# Patient Record
Sex: Male | Born: 1955 | Race: White | Hispanic: No | Marital: Married | Smoking: Former smoker
Health system: Southern US, Community
[De-identification: ages and names within clinical notes are randomized; demographics above are authoritative.]

## PROBLEM LIST (undated history)

## (undated) ENCOUNTER — Emergency Department (HOSPITAL_BASED_OUTPATIENT_CLINIC_OR_DEPARTMENT_OTHER): Payer: BLUE CROSS/BLUE SHIELD | Source: Home / Self Care

## (undated) DIAGNOSIS — E785 Hyperlipidemia, unspecified: Secondary | ICD-10-CM

## (undated) DIAGNOSIS — F329 Major depressive disorder, single episode, unspecified: Secondary | ICD-10-CM

## (undated) DIAGNOSIS — M5136 Other intervertebral disc degeneration, lumbar region: Secondary | ICD-10-CM

## (undated) DIAGNOSIS — M542 Cervicalgia: Secondary | ICD-10-CM

## (undated) DIAGNOSIS — G8929 Other chronic pain: Secondary | ICD-10-CM

## (undated) DIAGNOSIS — G709 Myoneural disorder, unspecified: Secondary | ICD-10-CM

## (undated) DIAGNOSIS — G4700 Insomnia, unspecified: Secondary | ICD-10-CM

## (undated) DIAGNOSIS — M51369 Other intervertebral disc degeneration, lumbar region without mention of lumbar back pain or lower extremity pain: Secondary | ICD-10-CM

## (undated) DIAGNOSIS — Z8489 Family history of other specified conditions: Secondary | ICD-10-CM

## (undated) DIAGNOSIS — R7303 Prediabetes: Secondary | ICD-10-CM

## (undated) DIAGNOSIS — N503 Cyst of epididymis: Secondary | ICD-10-CM

## (undated) DIAGNOSIS — I10 Essential (primary) hypertension: Secondary | ICD-10-CM

## (undated) DIAGNOSIS — N529 Male erectile dysfunction, unspecified: Secondary | ICD-10-CM

## (undated) DIAGNOSIS — M1990 Unspecified osteoarthritis, unspecified site: Secondary | ICD-10-CM

## (undated) DIAGNOSIS — Z8249 Family history of ischemic heart disease and other diseases of the circulatory system: Secondary | ICD-10-CM

## (undated) DIAGNOSIS — G43909 Migraine, unspecified, not intractable, without status migrainosus: Secondary | ICD-10-CM

## (undated) DIAGNOSIS — F909 Attention-deficit hyperactivity disorder, unspecified type: Secondary | ICD-10-CM

## (undated) DIAGNOSIS — F32A Depression, unspecified: Secondary | ICD-10-CM

## (undated) HISTORY — DX: Essential (primary) hypertension: I10

## (undated) HISTORY — DX: Other chronic pain: G89.29

## (undated) HISTORY — DX: Family history of ischemic heart disease and other diseases of the circulatory system: Z82.49

## (undated) HISTORY — DX: Major depressive disorder, single episode, unspecified: F32.9

## (undated) HISTORY — DX: Migraine, unspecified, not intractable, without status migrainosus: G43.909

## (undated) HISTORY — PX: VASECTOMY: SHX75

## (undated) HISTORY — DX: Hyperlipidemia, unspecified: E78.5

## (undated) HISTORY — PX: NECK SURGERY: SHX720

## (undated) HISTORY — DX: Cyst of epididymis: N50.3

## (undated) HISTORY — DX: Insomnia, unspecified: G47.00

## (undated) HISTORY — DX: Male erectile dysfunction, unspecified: N52.9

## (undated) HISTORY — DX: Attention-deficit hyperactivity disorder, unspecified type: F90.9

## (undated) HISTORY — DX: Unspecified osteoarthritis, unspecified site: M19.90

## (undated) HISTORY — DX: Other intervertebral disc degeneration, lumbar region without mention of lumbar back pain or lower extremity pain: M51.369

## (undated) HISTORY — DX: Cervicalgia: M54.2

## (undated) HISTORY — DX: Depression, unspecified: F32.A

## (undated) HISTORY — DX: Other intervertebral disc degeneration, lumbar region: M51.36

---

## 2001-09-15 ENCOUNTER — Encounter: Admission: RE | Admit: 2001-09-15 | Discharge: 2001-09-15 | Payer: Self-pay | Admitting: *Deleted

## 2001-10-09 ENCOUNTER — Encounter: Admission: RE | Admit: 2001-10-09 | Discharge: 2001-10-09 | Payer: Self-pay | Admitting: Internal Medicine

## 2003-06-21 ENCOUNTER — Encounter: Admission: RE | Admit: 2003-06-21 | Discharge: 2003-06-21 | Payer: Self-pay | Admitting: Family Medicine

## 2007-01-09 ENCOUNTER — Ambulatory Visit (HOSPITAL_BASED_OUTPATIENT_CLINIC_OR_DEPARTMENT_OTHER): Admission: RE | Admit: 2007-01-09 | Discharge: 2007-01-09 | Payer: Self-pay | Admitting: Internal Medicine

## 2007-01-11 ENCOUNTER — Ambulatory Visit: Payer: Self-pay | Admitting: Internal Medicine

## 2007-01-15 ENCOUNTER — Encounter: Admission: RE | Admit: 2007-01-15 | Discharge: 2007-01-15 | Payer: Self-pay | Admitting: Neurosurgery

## 2007-04-15 ENCOUNTER — Ambulatory Visit (HOSPITAL_COMMUNITY): Admission: RE | Admit: 2007-04-15 | Discharge: 2007-04-16 | Payer: Self-pay | Admitting: Neurosurgery

## 2008-05-10 ENCOUNTER — Ambulatory Visit: Payer: Self-pay | Admitting: Family Medicine

## 2008-05-10 DIAGNOSIS — E785 Hyperlipidemia, unspecified: Secondary | ICD-10-CM | POA: Insufficient documentation

## 2008-05-10 DIAGNOSIS — I10 Essential (primary) hypertension: Secondary | ICD-10-CM | POA: Insufficient documentation

## 2008-05-10 DIAGNOSIS — F329 Major depressive disorder, single episode, unspecified: Secondary | ICD-10-CM | POA: Insufficient documentation

## 2008-05-10 LAB — CONVERTED CEMR LAB
ALT: 51 units/L (ref 0–53)
AST: 31 units/L (ref 0–37)
Albumin: 4.4 g/dL (ref 3.5–5.2)
BUN: 18 mg/dL (ref 6–23)
Basophils Absolute: 0 10*3/uL (ref 0.0–0.1)
Basophils Relative: 0.3 % (ref 0.0–3.0)
Calcium: 9.8 mg/dL (ref 8.4–10.5)
Creatinine, Ser: 0.8 mg/dL (ref 0.4–1.5)
Eosinophils Absolute: 0.1 10*3/uL (ref 0.0–0.7)
Eosinophils Relative: 1.7 % (ref 0.0–5.0)
GFR calc Af Amer: 131 mL/min
GFR calc non Af Amer: 108 mL/min
MCV: 91 fL (ref 78.0–100.0)
Neutro Abs: 2.9 10*3/uL (ref 1.4–7.7)
Platelets: 188 10*3/uL (ref 150–400)
Potassium: 4.5 meq/L (ref 3.5–5.1)
Sodium: 139 meq/L (ref 135–145)
TSH: 0.28 microintl units/mL — ABNORMAL LOW (ref 0.35–5.50)
Total Protein: 7.2 g/dL (ref 6.0–8.3)

## 2008-05-11 ENCOUNTER — Telehealth: Payer: Self-pay | Admitting: Family Medicine

## 2008-05-17 ENCOUNTER — Telehealth: Payer: Self-pay | Admitting: *Deleted

## 2008-05-24 ENCOUNTER — Telehealth: Payer: Self-pay | Admitting: *Deleted

## 2008-06-01 ENCOUNTER — Ambulatory Visit: Payer: Self-pay | Admitting: Family Medicine

## 2008-06-01 DIAGNOSIS — J45909 Unspecified asthma, uncomplicated: Secondary | ICD-10-CM | POA: Insufficient documentation

## 2008-06-01 DIAGNOSIS — M1990 Unspecified osteoarthritis, unspecified site: Secondary | ICD-10-CM | POA: Insufficient documentation

## 2008-07-20 ENCOUNTER — Telehealth: Payer: Self-pay | Admitting: Family Medicine

## 2008-07-20 ENCOUNTER — Ambulatory Visit: Payer: Self-pay | Admitting: Family Medicine

## 2008-07-21 ENCOUNTER — Ambulatory Visit: Payer: Self-pay | Admitting: Family Medicine

## 2008-07-21 LAB — CONVERTED CEMR LAB: Testosterone: 393.57 ng/dL (ref 350.00–890.00)

## 2008-09-15 ENCOUNTER — Ambulatory Visit: Payer: Self-pay | Admitting: Family Medicine

## 2008-09-20 LAB — CONVERTED CEMR LAB
ALT: 48 units/L (ref 0–53)
AST: 34 units/L (ref 0–37)
Albumin: 3.9 g/dL (ref 3.5–5.2)
Alkaline Phosphatase: 69 units/L (ref 39–117)
Bilirubin, Direct: 0 mg/dL (ref 0.0–0.3)
Cholesterol: 179 mg/dL (ref 0–200)
HDL: 37.6 mg/dL — ABNORMAL LOW (ref >39.00)
LDL Cholesterol: 110 mg/dL — ABNORMAL HIGH (ref 0–99)
Testosterone: 254 ng/dL — ABNORMAL LOW (ref 350.00–890.00)
Total Bilirubin: 0.7 mg/dL (ref 0.3–1.2)
Total CHOL/HDL Ratio: 5
Total Protein: 7.1 g/dL (ref 6.0–8.3)
Triglycerides: 157 mg/dL — ABNORMAL HIGH (ref 0.0–149.0)
VLDL: 31.4 mg/dL (ref 0.0–40.0)

## 2008-10-14 ENCOUNTER — Ambulatory Visit: Payer: Self-pay | Admitting: Family Medicine

## 2008-10-18 ENCOUNTER — Telehealth: Payer: Self-pay | Admitting: Family Medicine

## 2009-03-03 ENCOUNTER — Telehealth: Payer: Self-pay | Admitting: Family Medicine

## 2009-05-13 ENCOUNTER — Encounter: Payer: Self-pay | Admitting: Family Medicine

## 2009-05-18 ENCOUNTER — Telehealth: Payer: Self-pay | Admitting: Family Medicine

## 2009-05-24 ENCOUNTER — Ambulatory Visit: Payer: Self-pay | Admitting: Family Medicine

## 2009-05-26 LAB — CONVERTED CEMR LAB
AST: 34 units/L (ref 0–37)
Alkaline Phosphatase: 63 units/L (ref 39–117)
BUN: 18 mg/dL (ref 6–23)
Bilirubin, Direct: 0.1 mg/dL (ref 0.0–0.3)
CO2: 29 meq/L (ref 19–32)
Calcium: 9.7 mg/dL (ref 8.4–10.5)
Creatinine, Ser: 0.9 mg/dL (ref 0.4–1.5)
GFR calc non Af Amer: 94.1 mL/min (ref >60)
Glucose, Bld: 84 mg/dL (ref 70–99)
Sodium: 140 meq/L (ref 135–145)
Total Bilirubin: 0.7 mg/dL (ref 0.3–1.2)

## 2009-09-09 ENCOUNTER — Telehealth: Payer: Self-pay | Admitting: Family Medicine

## 2009-11-10 ENCOUNTER — Telehealth: Payer: Self-pay | Admitting: Family Medicine

## 2010-06-06 NOTE — Progress Notes (Signed)
Summary: requesting labs  Phone Note Call from Patient Call back at [phone]   Caller: Patient-live call Summary of Call: on chloresterol meds. pt is requesting to have liver labs done. ok to order? Initial call taken by: Warnell Forester,  May 18, 2009 4:36 PM  Follow-up for Phone Call        cpx 5/11.............Marland Kitchenok to set up cpx 5/11........labs ok untill then Follow-up by: Roderick Pee MD,  May 18, 2009 5:14 PM  Additional Follow-up for Phone Call Additional follow up Details #1::        Had labs done at [facility] and his enzyme count was 94. He does not trust the [place]. that is why he is requesting the labs earlier. He did take some tylenol prior to having the labs done. Additional Follow-up by: Warnell Forester,  May 19, 2009 2:23 PM    Additional Follow-up for Phone Call Additional follow up Details #2::    per Dr. Tawanna Cooler ok for labs to be done.........Marland KitchenDoristine Devoid  May 19, 2009 5:09 PM   Additional Follow-up for Phone Call Additional follow up Details #3:: Details for Additional Follow-up Action Taken: will come in on next Tuesday to have ALT labs per dr Tawanna Cooler. Additional Follow-up by: Warnell Forester,  May 20, 2009 9:35 AM

## 2010-06-06 NOTE — Assessment & Plan Note (Signed)
Summary: ALT (SGPT)-OK PER DR Bradrick Kamau//CCM   Vital Signs:  Patient profile:   55 year old male Weight:      209 pounds Temp:     98.1 degrees F Pulse rate:   60 / minute Resp:     12 per minute BP sitting:   122 / 98  (left arm)  Vitals Entered By: Kern Reap CMA Duncan Dull) (May 24, 2009 11:25 AM)   History of Present Illness: Aaron Adams is a 55 year old male comes in today for evaluation of two problems.  He went to the [place] for physical and was told his liver function study was elevated.  His ALT was 94 are normal.  63.  At that time.  He was drinking one to two beers for his evening meal daily.  He stopped the alcohol and is here for follow-up.  Liver functions.  He also takes simvastatin 20 mg nightly, which may also cause abnormalities, and LFTs.  GI review of systems negative.  Yesterday he awoke with severe right and left lower lumbar back pain.  He describes the pain as constant, sharp, a 9 on a scale of one to 10 without radiation.  Neurologic review of systems negative.  This has been a recurrent problem.  Dr. Venetia Maxon is his neurosurgeon is operate on his neck.  Is also seen in for low back pain.  He still only has degenerative disk disease.  Preventive Screening-Counseling & Management  Alcohol-Tobacco     Smoking Status: quit  Comments: quit age 7  Allergies (verified): No Known Drug Allergies  Comments:  Nurse/Medical Assistant: c/o muscle spasm lower back since yesterday, difficulty straightening when bends over--tried motrin 800mg  yesterday and venlafaxin cream without relief  The patient's medications and allergies were reviewed with the patient and were updated in the Medication and Allergy Lists. Kern Reap CMA Duncan Dull) (May 24, 2009 11:29 AM)  Past History:  Past medical, surgical, family and social histories (including risk factors) reviewed for relevance to current acute and chronic problems.  Past Medical History: Reviewed history from 06/01/2008  and no changes required. Depression Hyperlipidemia Hypertension Allergic rhinitis Asthma erectile dysfunction low testosterone Osteoarthritis  Past Surgical History: Reviewed history from 05/10/2008 and no changes required. neck surgery x 2  Family History: Reviewed history from 05/10/2008 and no changes required. Father: hx of back surgery Mother: fibermyalgia , deg. disk disease Siblings: 2 sisters healty                1 sister overdose  Social History: Reviewed history from 05/10/2008 and no changes required. Occupation: self IT sales professional parts Married remarried  air [employer] x 4 yr. Former Smoker Alcohol use-yes Drug use-no Regular exercise-no  Review of Systems      See HPI  Physical Exam  General:  Well-developed,well-nourished,in no acute distress; alert,appropriate and cooperative throughout examination Abdomen:  Bowel sounds positive,abdomen soft and non-tender without masses, organomegaly or hernias noted. Msk:  No deformity or scoliosis noted of thoracic or lumbar spine.   Pulses:  R and L carotid,radial,femoral,dorsalis pedis and posterior tibial pulses are full and equal bilaterally Extremities:  No clubbing, cyanosis, edema, or deformity noted with normal full range of motion of all joints.   Neurologic:  No cranial nerve deficits noted. Station and gait are normal. Plantar reflexes are down-going bilaterally. DTRs are symmetrical throughout. Sensory, motor and coordinative functions appear intact.   Problems:  Medical Problems Added: 1)  Dx of Abnormal Blood Chemistry , Other  (ICD-790.6) 2)  Dx  of Back Pain  (ICD-724.5)  Impression & Recommendations:  Problem # 1:  ABNORMAL BLOOD CHEMISTRY , OTHER (ICD-790.6) Assessment New  Orders: Venipuncture (29562) TLB-BMP (Basic Metabolic Panel-BMET) (80048-METABOL) TLB-Hepatic/Liver Function Pnl (80076-HEPATIC) Prescription Created Electronically [id number])  Problem # 2:  BACK PAIN  (ICD-724.5) Assessment: New  His updated medication list for this problem includes:    Vicodin Es 7.5-750 Mg Tabs (Hydrocodone-acetaminophen) .Marland Kitchen... Take 1 tablet by mouth four times a day  Orders: Venipuncture (57846) TLB-BMP (Basic Metabolic Panel-BMET) (80048-METABOL) TLB-Hepatic/Liver Function Pnl (80076-HEPATIC) Prescription Created Electronically [id number])  Complete Medication List: 1)  Simvastatin 20 Mg Tabs (Simvastatin) .... Take one tab once daily 2)  Cymbalta 60 Mg Cpep (Duloxetine hcl) .... Take 1 capsule by mouth once a day 3)  Viagra 50 Mg Tabs (Sildenafil citrate) .... Uad 4)  Androgel Pump 1 % Gel (Testosterone) .... 2 pumps daily 5)  Lisinopril 40 Mg Tabs (Lisinopril) .... Take 1 tablet by mouth every morning 6)  Vicodin Es 7.5-750 Mg Tabs (Hydrocodone-acetaminophen) .... Take 1 tablet by mouth four times a day  Patient Instructions: 1)  stay at complete bed rest for 48 hours, then begin ambulation walking and lying down and avoid sitting.  Take prednisone two tabs daily for 3 days, one for 3 days, a half for 3 days, then stop. 2)  He may also take Vicodin, one tablet 4 times a day as needed for severe pain. 3)  I will call you w y  Lab work.   Prescriptions: VICODIN ES 7.5-750 MG TABS (HYDROCODONE-ACETAMINOPHEN) Take 1 tablet by mouth four times a day  #50 x 1   Entered and Authorized by:   Roderick Pee MD   Signed by:   Roderick Pee MD on 05/24/2009   Method used:   Print then Give to Patient   RxID:   [id number]

## 2010-06-06 NOTE — Progress Notes (Signed)
Summary: rx  Phone Note Call from Patient   Summary of Call: patient is calling because he is in [place] and forgot to take his medication with him.   he would like a 5 day supply.  is this okay to fill? Initial call taken by: Kern Reap CMA Duncan Dull),  Sep 09, 2009 2:01 PM  Follow-up for Phone Call        ok Follow-up by: Roderick Pee MD,  Sep 09, 2009 2:05 PM  Additional Follow-up for Phone Call Additional follow up Details #1::        Phone Call Completed  week supply called into [phone] Additional Follow-up by: Kern Reap CMA Duncan Dull),  Sep 09, 2009 3:16 PM

## 2010-06-06 NOTE — Progress Notes (Signed)
Summary: cymbalta  Phone Note Call from Patient   Summary of Call: would like a refill of cymbalta at a lower dose Initial call taken by: Kern Reap CMA Duncan Dull),  November 10, 2009 9:57 AM    New/Updated Medications: CYMBALTA 30 MG CPEP (DULOXETINE HCL) take one tab by mouth once daily Prescriptions: CYMBALTA 30 MG CPEP (DULOXETINE HCL) take one tab by mouth once daily  #90 x 3   Entered by:   Kern Reap CMA (AAMA)   Authorized by:   Roderick Pee MD   Signed by:   Kern Reap CMA (AAMA) on 11/10/2009   Method used:   [facility] to        [facility]  [address] [phone]* (retail)       [address] [place], [place]  [address]       Ph: [phone] or [phone]       Fax: [phone]   RxID:   [id number]

## 2010-06-19 ENCOUNTER — Other Ambulatory Visit: Payer: Self-pay | Admitting: Family Medicine

## 2010-07-06 HISTORY — PX: OTHER SURGICAL HISTORY: SHX169

## 2010-07-16 ENCOUNTER — Other Ambulatory Visit: Payer: Self-pay | Admitting: Family Medicine

## 2010-09-19 NOTE — Procedures (Signed)
Aaron Adams, Aaron Adams              ACCOUNT NO.:  [id number]   MEDICAL RECORD NO.:  [id number]          PATIENT TYPE:  OUT   LOCATION:  SLEEP CENTER                 FACILITY:  [facility]   PHYSICIAN:  Clinton D. Maple Hudson, MD, FCCP, FACPDATE OF BIRTH:  06-02-56   DATE OF STUDY:  01/09/2007                            NOCTURNAL POLYSOMNOGRAM   REFERRING PHYSICIAN:  Marland Kitchen, M.D.   INDICATION FOR STUDY:   EPWORTH SLEEPINESS SCORE:   MEDICATIONS:  Home medications listed:  Ambien 10 mg, simvastatin,  lisinopril, Effexor.   A diagnostic NPSG protocol was requested.   SLEEP ARCHITECTURE:  Total sleep time 323 minutes with sleep efficiency  71%.  Stage I is 5%, stage II 87%, stage III absent, REM 8% of total  sleep time.  Sleep latency 86 minutes, REM latency 325 minutes, awake  after sleep onset 51 minutes, arousal index 7.2.  Ambien 10 mg was taken  at 10:35 p.m. and a second dose of 1/2 Ambien tablet was taken at 11:55  p.m. for difficulty initiating sleep.   RESPIRATORY DATA:  Diagnostic NPSG protocol requested.  Apnea-hypopnea  index (AHI, RDI) 25.8 obstructive events per hour, indicating moderate  obstructive sleep apnea/hypopnea syndrome.  This reflected one  obstructive apnea and 138 hypopneas.  Events were not positional.  REM  AHI 0 per hour.   OXYGEN DATA:  Loud snoring with oxygen desaturation nadir 85%.  Mean  oxygen saturation through the study was 97% on room air.   CARDIAC DATA:  Normal sinus rhythm.   MOVEMENT-PARASOMNIA:  Occasional limb jerk with insignificant effect on  sleep.  Bathroom x1.   IMPRESSIONS-RECOMMENDATIONS:  1. Sleep architecture remarkable for sustained sleep only after      midnight, having taken 15 mg of Ambien.  Reduced percentage rapid      eye movement is nonspecific and may reflect unfamiliar environment      but may also reflect use of antidepressant medication.  2. Moderate obstructive sleep apnea/hypopnea syndrome, apnea-hypopnea  index 25.8 per hour.  Events were mostly hypopneas and      nonpositional  Loud snoring was associated with oxygen desaturation      nadir of 85% and mean oxygen saturation through the study is 97% on      room air.  3. Continuous positive airway pressure therapy was not initiated      because of the order protocol requested.      Consider return for continuous positive airway pressure titration      or evaluate for alternative therapies as appropriate.      Clinton D. Maple Hudson, MD, [facility], FACP  Diplomate, Biomedical engineer of Sleep Medicine  Electronically Signed     CDY/MEDQ  D:  01/12/2007 09:44:30  T:  01/12/2007 17:28:26  Job:  [id number]   cc:   Marland Kitchen, M.D.  [facility]  [address]  [place], [place] [address]

## 2010-09-19 NOTE — Op Note (Signed)
[name], [name]              ACCOUNT NO.:  [id number]   MEDICAL RECORD NO.:  [id number]          PATIENT TYPE:  OIB   LOCATION:  3172                         FACILITY:  [facility]   PHYSICIAN:  Danae Orleans. Venetia Maxon, M.D.  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   OPERATIONS:  1. Exploration and fusion C5 through C7 levels.  2. Removal of anterior cervical plate C5 through C7.  3. Anterior cervical decompression and fusion C4 through C5 with      allograft bone graft, autograft and anterior cervical plate.   SURGEON:  Danae Orleans. Venetia Maxon, M.D.   Threasa HeadsMarisa Hua.   ANESTHESIA:  General endotracheal anesthesia.   ESTIMATED BLOOD LOSS:  Minimal.   COMPLICATIONS:  None.   DISPOSITION:  Recovery.   INDICATIONS FOR PROCEDURE:  Aaron Adams is a 55 year old man with  neck pain and a disk herniation at C4-5.  He has left C4 radiculopathy  and myelopathy.  He has had prior surgery at C5 through C7 levels.  He  also has neck pain.  It was elected to take him to surgery to confirm  his previous fusion is solid and also to remove the anterior cervical  plate and then to perform anterior cervical decompression and fusion at  C4 through C5 levels.   DESCRIPTION OF PROCEDURE:  Aaron Adams was brought to the operating  room.  Following the satisfactory and uncomplicated induction of general  endotracheal anesthesia and placement of intravenous lines, the patient  was placed in the supine position on the operating table.  His neck was  maintained in neutral alignment.  An incision was made on the  right side  of midline which is the site of his prior surgery slightly above his  previous incision, carried through the platysma layer and then  subplatysmal dissection was performed exposing the anterior border of  the sternocleidomastoid muscle using blunt dissection.  Operating  through previous scar tissue, the anterior cervical spine was  identified.  Using a variety of Kittner pushes, the investing soft  tissue was removed overlying the previously placed Orion plate.  The  previously placed screws were then removed and the plate was removed.  This required a considerable amount of effort because there appeared to  be bone overgrowth overlying the plate.  This was chiseled out with  resultant loosening of the plate so that it could be removed.  Subsequently using loupe magnification, the sites of previous bone  grafting were very carefully inspected and there did not appear to be  any lucency or evidence of any pseudoarthrosis.  There did not appear to  be motion at the previously operated level.  Subsequently bent spinal  needle was placed over the C4-5 level and this was confirmed  on  intraoperative x-ray.  The longus colli muscles were taken down from the  anterior cervical spine.  Self-retaining shadow line retractor along  with up and down retraction was placed to facilitate exposure.  Large  ventral osteophyte was removed with Leksell rongeur and saved for later  use in bone grafting.  Distraction pins were placed at C4 and C5.  Using  distractor, the interspace was opened.  The endplates were stripped of  residual disk material and removed with variety of pituitary rongeurs.  Subsequently the endplates were decorticated using high speed drill  along with uncinate spurs which were removed.  These bone drilled was  then saved for later use in bone grafting.  Subsequently the microscope  was brought into the field.  Using microdissection technique, the  herniated disk was  removed.  The spinal cord dura was decompressed.  Particularly on the left the C5 nerve root was extensively decompressed  so that it extended out the neural foramen and also the right C4-5  foramen was decompressed.  Hemostasis was assured.  After trial sizing,  a 6 mm bone allograft wedge was selected, fashioned with high speed  drill, packed with morcellized bone autograft, inserted into the  interspace and counter sunk appropriately.  Retraction was removed.  The  14 mm trestle anterior cervical plate was then placed with two 14 mm  screws at C4 and two 14 mm screws at C5.  All screws had excellent  purchase.  Locking mechanisms were engaged.  Final x-ray demonstrated  well positioned interbody graft in the anterior cervical plate.  There  appeared to be some oozing from soft tissues.  The screw holes were  packed with Gelfoam.  Hemostasis was assured.  The wound was irrigated.  Soft tissues were inspected and found to be in good repair.  Because of  the oozing from the previously operated site, it was elected to place a  #7 JP drain which was placed through a separate stab incision.  The  platysma layer was then closed with 3-0 Vicryl sutures.  The skin edges  were approximated with 3-0 Vicryl interrupted inverted sutures.  The  wound was dressed with Benzoin, Steri-Strips, Telfa gauze and tape.  The  patient was extubated in the operating room and taken to the recovery  room in satisfactory condition having tolerated the operation well.  Counts were correct at the end of the case.      Danae Orleans. Venetia Maxon, M.D.  Electronically Signed     JDS/MEDQ  D:  04/15/2007  T:  04/15/2007  Job:  [id number]

## 2010-10-04 ENCOUNTER — Other Ambulatory Visit: Payer: Self-pay | Admitting: *Deleted

## 2010-10-04 MED ORDER — LISINOPRIL 40 MG PO TABS: 40.0000 mg | ORAL_TABLET | Freq: Every day | ORAL | Status: DC

## 2011-02-07 ENCOUNTER — Telehealth: Payer: Self-pay | Admitting: Family Medicine

## 2011-02-07 NOTE — Telephone Encounter (Signed)
patient  Would like to know if he could go to a cardiologist Dr Imogene Burn?  And also he would like a refill of Androgel?

## 2011-02-07 NOTE — Telephone Encounter (Signed)
His father had scans done on his regarding his internal arteries, that was done @ [facility]. They told him there that it may be hereditary and would like to speak with Fleet Contras about this and his meds.

## 2011-02-08 MED ORDER — TESTOSTERONE 20.25 MG/ACT (1.62%) TD GEL: 1.0000 | Freq: Every day | TRANSDERMAL | Status: DC

## 2011-02-08 NOTE — Telephone Encounter (Signed)
Spoke with patient and referral sent and rx ready for pick up

## 2011-02-08 NOTE — Telephone Encounter (Signed)
Please refer him to Dr. Parke Simmers, one of our cardiologists for evaluation.  Okay to refill AndroGel until  Next physical

## 2011-02-12 ENCOUNTER — Telehealth: Payer: Self-pay

## 2011-02-12 LAB — BASIC METABOLIC PANEL
BUN: 12
Calcium: 9.6
Creatinine, Ser: 0.88

## 2011-02-12 LAB — CBC
Hemoglobin: 14
MCHC: 34.2
Platelets: 221
RBC: 4.5
RDW: 13.4

## 2011-02-12 NOTE — Telephone Encounter (Signed)
Pt called in and stated that he had picked up an rx for Androgel on Friday and [facility] he was told a PA needed to be done before he could get his medication.

## 2011-02-13 NOTE — Telephone Encounter (Signed)
Per [place] she has received PA

## 2011-02-19 ENCOUNTER — Encounter: Payer: Self-pay | Admitting: Cardiology

## 2011-02-21 ENCOUNTER — Encounter: Payer: Self-pay | Admitting: Cardiology

## 2011-02-21 ENCOUNTER — Ambulatory Visit (INDEPENDENT_AMBULATORY_CARE_PROVIDER_SITE_OTHER): Payer: BC Managed Care – PPO | Admitting: Cardiology

## 2011-02-21 DIAGNOSIS — I10 Essential (primary) hypertension: Secondary | ICD-10-CM

## 2011-02-21 DIAGNOSIS — Z8249 Family history of ischemic heart disease and other diseases of the circulatory system: Secondary | ICD-10-CM

## 2011-02-21 NOTE — Assessment & Plan Note (Signed)
Blood pressure controlled. Continue present medications.potassium and renal function monitored by primary care. 

## 2011-02-21 NOTE — Assessment & Plan Note (Signed)
Continue statin.managed by primary care. 

## 2011-02-21 NOTE — Assessment & Plan Note (Signed)
Patient with a family history of aneurysm. His father has a thoracic aneurysm recently diagnosed in his grandfather died in his early 50s of an abdominal aortic aneurysm. Also with risk factors of hypertension and remote tobacco use. Plan CTA to exclude thoracic and abdominal aortic aneurysm.

## 2011-02-21 NOTE — Patient Instructions (Signed)
CTA OF CHEST AND ABDOMEN AT [facility] Friday 02-23-11 @ 9AM

## 2011-02-21 NOTE — Progress Notes (Signed)
HPI: 55 year old male with no prior cardiac history or evaluation of possible aneurysm. Patient's father recently diagnosed with thoracic aneurysm in [place]. Patient was told that he also needed screening. It should be noted the patient's grandfather died of an abdominal aortic aneurysm in his early 55s. He has mild dyspnea at times but no orthopnea, PND, pedal edema, palpitations, syncope or chest pain.  Current Outpatient Prescriptions  Medication Sig Dispense Refill  . DULoxetine (CYMBALTA) 60 MG capsule Take 60 mg by mouth daily.        Marland Kitchen HYDROcodone-acetaminophen (VICODIN ES) 7.5-750 MG per tablet Take 1 tablet by mouth 4 (four) times daily.        Marland Kitchen lisinopril (PRINIVIL,ZESTRIL) 40 MG tablet Take 1 tablet (40 mg total) by mouth daily.  90 tablet  1  . sildenafil (VIAGRA) 50 MG tablet Take 50 mg by mouth as directed.        . simvastatin (ZOCOR) 20 MG tablet Take 20 mg by mouth at bedtime.        . Testosterone (ANDROGEL PUMP) 20.25 MG/ACT (1.62%) GEL Place 1 Act onto the skin daily.  75 g  5    No Known Allergies  Past Medical History  Diagnosis Date  . Depression   . Hyperlipidemia   . Hypertension   . Allergic rhinitis   . Erectile dysfunction   . Low testosterone   . Osteoarthritis     Past Surgical History  Procedure Date  . Neck surgery     Cervical fusion twice  . Rotator cuff surgery     History   Social History  . Marital Status: Married    Spouse Name: N/A    Number of Children: 3  . Years of Education: N/A   Occupational History  . Self employed - Sports administrator    Social History Main Topics  . Smoking status: Former Games developer  . Smokeless tobacco: Not on file  . Alcohol Use: Yes     Occasional  . Drug Use: No  . Sexually Active: Not on file   Other Topics Concern  . Not on file   Social History Narrative   Married, Oceanographer force x4 yearsNo regular exercise    Family History  Problem Relation Age of Onset  . Fibromyalgia Mother   .  Heart disease Father     thoracic anneurysm and valve replacement  . Aneurysm Paternal Grandfather     Abdominal    ROS: no fevers or chills, productive cough, hemoptysis, dysphasia, odynophagia, melena, hematochezia, dysuria, hematuria, rash, seizure activity, orthopnea, PND, pedal edema, claudication. Remaining systems are negative.  Physical Exam: General:  Well developed/well nourished in NAD Skin warm/dry Patient not depressed No peripheral clubbing Back-normal HEENT-normal/normal eyelids Neck supple/normal carotid upstroke bilaterally; no bruits; no JVD; no thyromegaly chest - CTA/ normal expansion CV - RRR/normal S1 and S2; no murmurs, rubs or gallops;  PMI nondisplaced Abdomen -NT/ND, no HSM, no mass, + bowel sounds, no bruit 2+ femoral pulses, no bruits Ext-no edema, chords, 2+ DP Neuro-grossly nonfocal  ECG normal sinus rhythm with no ST changes.

## 2011-02-22 ENCOUNTER — Other Ambulatory Visit: Payer: Self-pay | Admitting: Cardiology

## 2011-02-22 ENCOUNTER — Other Ambulatory Visit: Payer: Self-pay | Admitting: *Deleted

## 2011-02-22 ENCOUNTER — Telehealth: Payer: Self-pay | Admitting: Cardiology

## 2011-02-22 DIAGNOSIS — Z8249 Family history of ischemic heart disease and other diseases of the circulatory system: Secondary | ICD-10-CM

## 2011-02-22 DIAGNOSIS — IMO0001 Reserved for inherently not codable concepts without codable children: Secondary | ICD-10-CM

## 2011-02-22 DIAGNOSIS — I10 Essential (primary) hypertension: Secondary | ICD-10-CM

## 2011-02-22 NOTE — Telephone Encounter (Signed)
Pt was scheduled for CTA Chest and Abdomen on 02-23-11@[facility]. Pt's insurance [employer] would only approve CTA Chest.  Per Dr. Jens Som, CTA Abdomen was cancelled and Abdomen U/S was scheduled.  CTA Chest@9  a.m. & Abd U/S@10  a.m.  Pt agrees and understands to be NPO after midnight and will arrive@8 :45am for testing.

## 2011-02-23 ENCOUNTER — Other Ambulatory Visit: Payer: Self-pay | Admitting: Cardiology

## 2011-02-23 ENCOUNTER — Ambulatory Visit (INDEPENDENT_AMBULATORY_CARE_PROVIDER_SITE_OTHER)
Admission: RE | Admit: 2011-02-23 | Discharge: 2011-02-23 | Disposition: A | Discharge status: Home / Self Care | Payer: BC Managed Care – PPO | Source: Ambulatory Visit | Attending: Cardiology | Admitting: Cardiology

## 2011-02-23 ENCOUNTER — Other Ambulatory Visit (HOSPITAL_BASED_OUTPATIENT_CLINIC_OR_DEPARTMENT_OTHER): Payer: BC Managed Care – PPO

## 2011-02-23 ENCOUNTER — Ambulatory Visit (HOSPITAL_BASED_OUTPATIENT_CLINIC_OR_DEPARTMENT_OTHER)
Admission: RE | Admit: 2011-02-23 | Discharge: 2011-02-23 | Disposition: A | Discharge status: Home / Self Care | Payer: BC Managed Care – PPO | Source: Ambulatory Visit | Attending: Cardiology | Admitting: Cardiology

## 2011-02-23 DIAGNOSIS — IMO0001 Reserved for inherently not codable concepts without codable children: Secondary | ICD-10-CM

## 2011-02-23 DIAGNOSIS — I10 Essential (primary) hypertension: Secondary | ICD-10-CM | POA: Insufficient documentation

## 2011-02-23 DIAGNOSIS — I708 Atherosclerosis of other arteries: Secondary | ICD-10-CM | POA: Insufficient documentation

## 2011-02-23 DIAGNOSIS — Z8249 Family history of ischemic heart disease and other diseases of the circulatory system: Secondary | ICD-10-CM | POA: Insufficient documentation

## 2011-02-23 DIAGNOSIS — Z0389 Encounter for observation for other suspected diseases and conditions ruled out: Secondary | ICD-10-CM

## 2011-02-23 DIAGNOSIS — I2510 Atherosclerotic heart disease of native coronary artery without angina pectoris: Secondary | ICD-10-CM | POA: Insufficient documentation

## 2011-02-23 MED ORDER — IOHEXOL 350 MG/ML SOLN
80.0000 mL | Freq: Once | INTRAVENOUS | Status: AC | PRN
  Administered 2011-02-23: 80 mL via INTRAVENOUS

## 2011-03-01 ENCOUNTER — Encounter: Payer: Self-pay | Admitting: *Deleted

## 2011-04-02 ENCOUNTER — Encounter: Payer: Self-pay | Admitting: Family Medicine

## 2011-04-02 ENCOUNTER — Ambulatory Visit (INDEPENDENT_AMBULATORY_CARE_PROVIDER_SITE_OTHER): Payer: BC Managed Care – PPO | Admitting: Family Medicine

## 2011-04-02 VITALS — BP 125/84 | HR 83 | Temp 98.1°F | Ht 70.0 in | Wt 203.0 lb

## 2011-04-02 DIAGNOSIS — Z23 Encounter for immunization: Secondary | ICD-10-CM

## 2011-04-02 DIAGNOSIS — M545 Low back pain, unspecified: Secondary | ICD-10-CM

## 2011-04-02 DIAGNOSIS — L0292 Furuncle, unspecified: Secondary | ICD-10-CM

## 2011-04-02 DIAGNOSIS — L0293 Carbuncle, unspecified: Secondary | ICD-10-CM

## 2011-04-02 MED ORDER — MUPIROCIN 2 % EX OINT: TOPICAL_OINTMENT | CUTANEOUS | Status: DC

## 2011-04-02 MED ORDER — CARISOPRODOL 350 MG PO TABS: 350.0000 mg | ORAL_TABLET | Freq: Four times a day (QID) | ORAL | Status: DC | PRN

## 2011-04-02 NOTE — Assessment & Plan Note (Signed)
Muscuoloskeletal strain, mild.  Muscle spasms may be playing a role. Discussed NSAIDs (he has 800mg  ibup at home, also has tramadol to use prn at home), and will add generic soma 350mg  q6h prn. Relative rest discussed, home stretches, ROM exercises.  Call if worsening/new sx's.

## 2011-04-02 NOTE — Progress Notes (Signed)
OFFICE NOTE  04/02/2011  CC:  Chief Complaint  Patient presents with  . Back Pain    lower back spasms x 2 weeks  . bump on lower jaw    right side, woke up with this AM     HPI:   Patient is a 55 y.o. Caucasian male, pt of Dr. Tawanna Cooler who is considering transferring care to our [facility] location due to living so close to [place];  who is here for lump on jaw and for low back spasms. Noted tender, red, swelling on right lower jaw this morning upon awakening.  Nothing was there yesterday. No fever, no malaise.  No teeth problems lately.  No prior hx of similar skin lesions in the past, no hx of MRSA.  Low back bothering him the last two weeks, located in paraspinous muscles around lower lumbar area bilat, waxes and wanes in intensity, made better with stretching and activity, worse with raising up after bending forward at the L-spine.  No trauma, no acute strain, no hx of LB problems that have required any imaging, intervention, or specialist eval.  He took a tramadol he had from prior surgery on neck and this helped the pain go away w/in 30 min.  Has ibup 800mg  tabs at home but hasn't taken any for this.  No radiation of the pain, no paresthesias, no LE weakness. No urinary complaints.  He started testosterone gel a few weeks ago and his father mentioned that this sometimes affects the prostate and that the back pain could be connected to this.   Pertinent PMH:  Past Medical History  Diagnosis Date  . Depression   . Hyperlipidemia   . Hypertension   . Allergic rhinitis   . Erectile dysfunction   . Low testosterone   . Osteoarthritis    Past Surgical History  Procedure Date  . Neck surgery 1996, 2009    Cervical fusion twice (one in [place] and one in [place] by Dr. Venetia Maxon [facility])  . Rotator cuff surgery 07/2010    left [facility], Dr. Madelon Lips)   History   Social History  . Marital Status: Married    Spouse Name: N/A    Number of Children: 3  . Years of Education: N/A   Occupational  History  . Self employed - Sports administrator    Social History Main Topics  . Smoking status: Former Smoker    Quit date: 05/07/1984  . Smokeless tobacco: Never Used  . Alcohol Use: Yes     Occasional  . Drug Use: No  . Sexually Active: Not on file   Other Topics Concern  . Not on file   Social History Narrative   Married, .  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  Smoked 1-2 ppd x 10 yrs, quit in 1986.No ETOH or drug use.     MEDS;  Not currently taking Vicodin Outpatient Prescriptions Prior to Visit  Medication Sig Dispense Refill  . DULoxetine (CYMBALTA) 60 MG capsule Take 60 mg by mouth daily.        Marland Kitchen lisinopril (PRINIVIL,ZESTRIL) 40 MG tablet Take 1 tablet (40 mg total) by mouth daily.  90 tablet  1  . sildenafil (VIAGRA) 50 MG tablet Take 50 mg by mouth as directed.        Marland Kitchen  simvastatin (ZOCOR) 20 MG tablet Take 20 mg by mouth at bedtime.        . Testosterone (ANDROGEL PUMP) 20.25 MG/ACT (1.62%) GEL Place 1 Act onto the skin daily.  75 g  5  . HYDROcodone-acetaminophen (VICODIN ES) 7.5-750 MG per tablet Take 1 tablet by mouth 4 (four) times daily.          PE: Blood pressure 125/84, pulse 83, temperature 98.1 F (36.7 C), temperature source Oral, height 5\' 10"  (1.778 m), weight 203 lb (92.08 kg). Gen: Alert, well appearing.  Patient is oriented to person, place, time, and situation. Pleasant affect, lucid thought and conversation. ENT: Ears: EACs clear, normal epithelium.  TMs with good light reflex and landmarks bilaterally.  Eyes: no injection, icteris, swelling, or exudate.  EOMI, PERRLA. Nose: no drainage or turbinate edema/swelling.  No injection or focal lesion.  Mouth: lips without lesion/swelling.  Oral mucosa pink and moist.  Dentition intact and without  obvious caries or gingival swelling.  Oropharynx without erythema, exudate, or swelling.  Along the right mandibular area there is a 2-3 cm soft tissue swelling, fluctant and warm, TTP, mild violacious hue on the skin directly overlying the focal swelling.  No drainage tract.  No vesicles or pustules.  No streaking.   Neck - No masses or thyromegaly or limitation in range of motion.  No LAD.  I do palpate his right submadibular gland a bit more easily than his left but this is nontender and is rubbery/moveable. CV: RRR, no m/r/g.   LUNGS: CTA bilat, nonlabored resps, good aeration in all lung fields. EXT: no clubbing, cyanosis, or edema.  BACK: ROM fully intact without pain except for pain in middle lumbar area diffusely when he extends from a fully forward flexed position.  Extension from neutral position actually relieves some tension/pain.  Nontender to palpation.  No deformity or muscle spasm appreciated.   SLR neg bilat.  Strength 5/5 prox and dist in LE bilat.  DTRs 2+ bilat in patellar and achilles areas.    LAB: none today  IMPRESSION AND PLAN:  Low back pain Muscuoloskeletal strain, mild.  Muscle spasms may be playing a role. Discussed NSAIDs (he has 800mg  ibup at home, also has tramadol to use prn at home), and will add generic soma 350mg  q6h prn. Relative rest discussed, home stretches, ROM exercises.  Call if worsening/new sx's.  Furuncle Vs epidermal inclusion cyst.  Discussed conservative option of heat tid to see if we could induce drainage but he has a busy week ahead with his father's health issues and appointments and doesn't feel like he could be compliant with these recommendations.   Therefore, we decided on I&D in office today.  Explained risks/benefits of procedure, pt agreed to proceed.  Sterile procedure used to excise a 2 cm elliptical portion of soft tissue overlying the focal swelling (after 1.5 cc of 2% lidocaine w/out epi was infused locally for analgesia).   Immediate return of bloody material, no pus or yellow/cheesy substance.  Probed extensively with curved hemostats to try to detect hidden pockets but none found. Lesion felt resolved to palpation after contents drained.  Packed about 3 inches of iodoform gauze into wound, covered with dry gauze dressing. Pt tolerated procedure well and there were no immediate complications.  Wound care discussed. Culture swab of contents sent to lab today. Bactroban ointment rx'd to apply to the area tid until it seals up. Warning signs of return of abscess/cyst were reviewed, as were warning  signs of cellulitis or systemic infection. F/u prn.     FOLLOW UP:  Return if symptoms worsen or fail to improve.

## 2011-04-02 NOTE — Assessment & Plan Note (Addendum)
Vs epidermal inclusion cyst.  Discussed conservative option of heat tid to see if we could induce drainage but he has a busy week ahead with his father's health issues and appointments and doesn't feel like he could be compliant with these recommendations.   Therefore, we decided on I&D in office today.  Explained risks/benefits of procedure, pt agreed to proceed.  Sterile procedure used to excise a 2 cm elliptical portion of soft tissue overlying the focal swelling (after 1.5 cc of 2% lidocaine w/out epi was infused locally for analgesia).  Immediate return of bloody material, no pus or yellow/cheesy substance.  Probed extensively with curved hemostats to try to detect hidden pockets but none found. Lesion felt resolved to palpation after contents drained.  Packed about 3 inches of iodoform gauze into wound, covered with dry gauze dressing. Pt tolerated procedure well and there were no immediate complications.  Wound care discussed. Culture swab of contents sent to lab today. Bactroban ointment rx'd to apply to the area tid until it seals up. Warning signs of return of abscess/cyst were reviewed, as were warning signs of cellulitis or systemic infection. F/u prn.

## 2011-04-05 ENCOUNTER — Other Ambulatory Visit: Payer: Self-pay | Admitting: Family Medicine

## 2011-04-05 LAB — WOUND CULTURE

## 2011-04-05 MED ORDER — TRAMADOL HCL 50 MG PO TABS: ORAL_TABLET | ORAL | Status: DC

## 2011-04-23 ENCOUNTER — Other Ambulatory Visit: Payer: Self-pay | Admitting: Family Medicine

## 2011-05-03 ENCOUNTER — Other Ambulatory Visit: Payer: Self-pay | Admitting: Family Medicine

## 2011-05-03 NOTE — Telephone Encounter (Signed)
I have attempted to contact this patient by phone with the following results: left message to return my call on answering machine (work).

## 2011-05-04 ENCOUNTER — Encounter: Payer: Self-pay | Admitting: Family Medicine

## 2011-05-04 MED ORDER — CARISOPRODOL 350 MG PO TABS: 350.0000 mg | ORAL_TABLET | Freq: Four times a day (QID) | ORAL | Status: AC | PRN

## 2011-05-04 NOTE — Telephone Encounter (Signed)
OK.  I'll RF soma x 1, but if low back still giving significant problem after this rx runs out I recommend physical therapy OR f/u in office for recheck.

## 2011-05-04 NOTE — Telephone Encounter (Signed)
Still has pain in lower back, "does not want to go away".  Pain is not worse.  Would like refill of Soma.  Please advise. Last seen 04/02/11, follow up if symptoms worsen.

## 2011-05-10 ENCOUNTER — Telehealth: Payer: Self-pay | Admitting: Family Medicine

## 2011-05-10 NOTE — Telephone Encounter (Signed)
Per pharmacy RX is ready to be picked up.  Message left on pt voicemail RX ready.

## 2011-05-10 NOTE — Telephone Encounter (Signed)
RX is at pharmacy since 05-04-11 and patient is informed

## 2011-05-10 NOTE — Telephone Encounter (Signed)
I called and RX is at the pharmacy as of 05-04-11 and pt is informed

## 2011-07-19 ENCOUNTER — Ambulatory Visit (INDEPENDENT_AMBULATORY_CARE_PROVIDER_SITE_OTHER): Payer: BC Managed Care – PPO | Admitting: Family Medicine

## 2011-07-19 ENCOUNTER — Encounter: Payer: Self-pay | Admitting: Family Medicine

## 2011-07-19 ENCOUNTER — Other Ambulatory Visit: Payer: Self-pay | Admitting: Family Medicine

## 2011-07-19 VITALS — BP 125/88 | HR 90 | Temp 97.8°F | Ht 70.0 in | Wt 199.0 lb

## 2011-07-19 DIAGNOSIS — IMO0001 Reserved for inherently not codable concepts without codable children: Secondary | ICD-10-CM

## 2011-07-19 DIAGNOSIS — M791 Myalgia, unspecified site: Secondary | ICD-10-CM

## 2011-07-19 DIAGNOSIS — M7918 Myalgia, other site: Secondary | ICD-10-CM

## 2011-07-19 DIAGNOSIS — M79609 Pain in unspecified limb: Secondary | ICD-10-CM

## 2011-07-19 DIAGNOSIS — I10 Essential (primary) hypertension: Secondary | ICD-10-CM

## 2011-07-19 DIAGNOSIS — M2550 Pain in unspecified joint: Secondary | ICD-10-CM
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
## 2011-07-20 LAB — CBC WITH DIFFERENTIAL/PLATELET
Basophils Relative: 1 % (ref 0–1)
Eosinophils Relative: 1 % (ref 0–5)
HCT: 42.6 % (ref 39.0–52.0)
Hemoglobin: 14.6 g/dL (ref 13.0–17.0)
MCH: 30.7 pg (ref 26.0–34.0)
MCHC: 34.3 g/dL (ref 30.0–36.0)
MCV: 89.5 fL (ref 78.0–100.0)
Monocytes Absolute: 0.4 10*3/uL (ref 0.1–1.0)
Monocytes Relative: 7 % (ref 3–12)
Neutro Abs: 3.1 10*3/uL (ref 1.7–7.7)
RDW: 13.4 % (ref 11.5–15.5)

## 2011-07-20 LAB — COMPREHENSIVE METABOLIC PANEL
Albumin: 5 g/dL (ref 3.5–5.2)
BUN: 11 mg/dL (ref 6–23)
CO2: 26 mEq/L (ref 19–32)
Glucose, Bld: 143 mg/dL — ABNORMAL HIGH (ref 70–99)
Sodium: 137 mEq/L (ref 135–145)
Total Bilirubin: 0.4 mg/dL (ref 0.3–1.2)
Total Protein: 7.1 g/dL (ref 6.0–8.3)

## 2011-07-20 LAB — SEDIMENTATION RATE: Sed Rate: 1 mm/hr (ref 0–16)

## 2011-07-20 LAB — RHEUMATOID FACTOR: Rhuematoid fact SerPl-aCnc: <10 IU/mL (ref <=14)

## 2011-07-20 LAB — CYCLIC CITRUL PEPTIDE ANTIBODY, IGG: Cyclic Citrullin Peptide Ab: <2 U/mL (ref 0.0–5.0)

## 2011-07-22 ENCOUNTER — Encounter: Payer: Self-pay | Admitting: Family Medicine

## 2011-07-22 NOTE — Assessment & Plan Note (Signed)
Also pain in anterior neck on right side.  No abnormality identified on exam today. Reassured pt, and I don't feel any diagnostics are indicated at this time. He'll monitor for additional/changing sx's and call or return prn.

## 2011-07-22 NOTE — Assessment & Plan Note (Signed)
He reports being told he has both fibromyalgia and rheumatoid arthritis. Need some old records for clarification. Will also recheck labs today: CBC, CMET, Rh factor, CCP antibody, ANA, ESR.

## 2011-08-20 ENCOUNTER — Other Ambulatory Visit: Payer: Self-pay | Admitting: Family Medicine

## 2011-08-20 ENCOUNTER — Other Ambulatory Visit: Payer: Self-pay | Admitting: *Deleted

## 2011-08-20 MED ORDER — TESTOSTERONE 20.25 MG/ACT (1.62%) TD GEL: 1.0000 | Freq: Every day | TRANSDERMAL | Status: DC

## 2011-08-21 NOTE — Telephone Encounter (Signed)
Pt last seen in office on 07/19/11 and needs follow up at this time.  RX filled by Dr. Nelida Meuse office.

## 2011-09-06 ENCOUNTER — Other Ambulatory Visit: Payer: Self-pay | Admitting: Family Medicine

## 2011-09-06 MED ORDER — SIMVASTATIN 20 MG PO TABS: 20.0000 mg | ORAL_TABLET | Freq: Every day | ORAL | Status: DC

## 2011-09-06 NOTE — Telephone Encounter (Signed)
RX sent

## 2011-10-16 ENCOUNTER — Other Ambulatory Visit: Payer: Self-pay | Admitting: Family Medicine

## 2012-03-01 ENCOUNTER — Other Ambulatory Visit: Payer: Self-pay | Admitting: Family Medicine

## 2012-03-03 NOTE — Telephone Encounter (Signed)
Will authorize 30d supply of med, but pt must make o/v follow up HTN appt prior to any FURTHER rf's.-thx

## 2012-03-03 NOTE — Telephone Encounter (Signed)
eScribe request for refill on LISINOPRIL Last filled - 10/16/11, #90 X 1 Last seen on - 07/2011  Follow up - past due Please advise refills.

## 2012-03-04 NOTE — Telephone Encounter (Signed)
appt scheduled for 03/12/12 @ 915 am.  Pt wants to discuss colonoscopy at this visit also.  Wants to have done before end of year.

## 2012-03-05 NOTE — Telephone Encounter (Signed)
I'll fill x 1 mo supply, but we need to check a testosterone level when he comes in for f/u early next month. Remind him to apply the testosterone 2-3 hours prior to his appt time so we can get an accurate level.-thx

## 2012-03-06 ENCOUNTER — Other Ambulatory Visit: Payer: Self-pay | Admitting: Family Medicine

## 2012-03-12 ENCOUNTER — Encounter: Payer: Self-pay | Admitting: Family Medicine

## 2012-03-12 ENCOUNTER — Ambulatory Visit (INDEPENDENT_AMBULATORY_CARE_PROVIDER_SITE_OTHER): Payer: BC Managed Care – PPO | Admitting: Family Medicine

## 2012-03-12 VITALS — BP 124/87 | HR 90 | Ht 70.0 in | Wt 196.0 lb

## 2012-03-12 DIAGNOSIS — E291 Testicular hypofunction: Secondary | ICD-10-CM | POA: Insufficient documentation

## 2012-03-12 DIAGNOSIS — Z1211 Encounter for screening for malignant neoplasm of colon: Secondary | ICD-10-CM | POA: Insufficient documentation

## 2012-03-12 DIAGNOSIS — Z125 Encounter for screening for malignant neoplasm of prostate: Secondary | ICD-10-CM | POA: Insufficient documentation

## 2012-03-12 DIAGNOSIS — E785 Hyperlipidemia, unspecified: Secondary | ICD-10-CM

## 2012-03-12 DIAGNOSIS — I10 Essential (primary) hypertension: Secondary | ICD-10-CM | POA: Insufficient documentation

## 2012-03-12 LAB — LIPID PANEL
Cholesterol: 221 mg/dL — ABNORMAL HIGH (ref 0–200)
HDL: 46.6 mg/dL (ref >39.00)
Triglycerides: 171 mg/dL — ABNORMAL HIGH (ref 0.0–149.0)
VLDL: 34.2 mg/dL (ref 0.0–40.0)

## 2012-03-12 LAB — COMPREHENSIVE METABOLIC PANEL
AST: 24 U/L (ref 0–37)
Alkaline Phosphatase: 70 U/L (ref 39–117)
BUN: 14 mg/dL (ref 6–23)
Calcium: 9.7 mg/dL (ref 8.4–10.5)
Creatinine, Ser: 0.9 mg/dL (ref 0.4–1.5)
Glucose, Bld: 101 mg/dL — ABNORMAL HIGH (ref 70–99)

## 2012-03-12 LAB — PSA: PSA: 0.62 ng/mL (ref 0.10–4.00)

## 2012-03-12 LAB — LDL CHOLESTEROL, DIRECT: Direct LDL: 148.6 mg/dL
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
## 2012-03-12 NOTE — Assessment & Plan Note (Signed)
PSA done today for prostate cancer screening PLUS for monitoring while on supplemental testosterone.

## 2012-03-12 NOTE — Assessment & Plan Note (Signed)
Lab Results  Component Value Date   TESTOSTERONE 180.60* 10/14/2008   I cannot see in the chart that he has had a follow up testosterone level since being started on androgel.  He has not felt improvement on 1 pump per day, and also no improvement after recent increase to 2 pumps per day for about a month.  He has had a 1 wk period off the med and just restarted it, so we'll wait and do testosterone level in 4 wks.  The timing of this blood draw was discussed with the patient.

## 2012-03-12 NOTE — Assessment & Plan Note (Signed)
Referred to GI today for his initial screening colonoscopy.

## 2012-03-12 NOTE — Assessment & Plan Note (Signed)
Problem stable.  Continue current medications and diet appropriate for this condition.  We have reviewed our general long term plan for this problem and also reviewed symptoms and signs that should prompt the patient to call or return to the office. Check lytes/cr today.  

## 2012-03-12 NOTE — Assessment & Plan Note (Addendum)
Lab Results  Component Value Date   CHOL 179 09/15/2008   HDL 37.60* 09/15/2008   LDLCALC 110* 09/15/2008   TRIG 157.0* 09/15/2008   CHOLHDL 5 09/15/2008   Due for FLP and transaminase monitoring.  These were ordered today.

## 2012-03-13 ENCOUNTER — Encounter: Payer: Self-pay | Admitting: Gastroenterology

## 2012-03-13 ENCOUNTER — Other Ambulatory Visit: Payer: Self-pay | Admitting: Family Medicine

## 2012-03-13 MED ORDER — ATORVASTATIN CALCIUM 20 MG PO TABS: 20.0000 mg | ORAL_TABLET | Freq: Every day | ORAL | Status: DC

## 2012-03-21 ENCOUNTER — Other Ambulatory Visit: Payer: Self-pay | Admitting: Family Medicine

## 2012-03-21 NOTE — Telephone Encounter (Signed)
eScribe request for refill on LISINOPRIL Last filled - 03/03/12, #30 X 0 Last seen on - 03/12/12 Refill sent per [facility] refill protocol.

## 2012-04-11 ENCOUNTER — Other Ambulatory Visit (INDEPENDENT_AMBULATORY_CARE_PROVIDER_SITE_OTHER): Payer: BC Managed Care – PPO

## 2012-04-11 DIAGNOSIS — E785 Hyperlipidemia, unspecified: Secondary | ICD-10-CM

## 2012-04-11 DIAGNOSIS — E291 Testicular hypofunction: Secondary | ICD-10-CM

## 2012-04-11 DIAGNOSIS — I10 Essential (primary) hypertension: Secondary | ICD-10-CM

## 2012-04-11 DIAGNOSIS — Z125 Encounter for screening for malignant neoplasm of prostate: Secondary | ICD-10-CM

## 2012-04-11 LAB — LIPID PANEL
Cholesterol: 224 mg/dL — ABNORMAL HIGH (ref 0–200)
Total CHOL/HDL Ratio: 5
Triglycerides: 186 mg/dL — ABNORMAL HIGH (ref 0.0–149.0)

## 2012-04-11 LAB — LDL CHOLESTEROL, DIRECT: Direct LDL: 161.3 mg/dL

## 2012-04-11 LAB — BASIC METABOLIC PANEL
BUN: 13 mg/dL (ref 6–23)
CO2: 29 mEq/L (ref 19–32)
Chloride: 102 mEq/L (ref 96–112)
Creatinine, Ser: 0.9 mg/dL (ref 0.4–1.5)
Glucose, Bld: 108 mg/dL — ABNORMAL HIGH (ref 70–99)
Potassium: 4.4 mEq/L (ref 3.5–5.1)

## 2012-04-16 ENCOUNTER — Other Ambulatory Visit: Payer: Self-pay | Admitting: Family Medicine

## 2012-04-18 ENCOUNTER — Other Ambulatory Visit: Payer: Self-pay | Admitting: Family Medicine

## 2012-04-18 MED ORDER — TESTOSTERONE CYPIONATE 200 MG/ML IM SOLN: 200.0000 mg | INTRAMUSCULAR | Status: DC

## 2012-04-21 ENCOUNTER — Ambulatory Visit (INDEPENDENT_AMBULATORY_CARE_PROVIDER_SITE_OTHER): Payer: BC Managed Care – PPO | Admitting: *Deleted

## 2012-04-21 DIAGNOSIS — E291 Testicular hypofunction: Secondary | ICD-10-CM

## 2012-04-21 MED ORDER — TESTOSTERONE CYPIONATE 200 MG/ML IM SOLN
200.0000 mg | INTRAMUSCULAR | Status: DC
  Administered 2012-04-21: 200 mg via INTRAMUSCULAR

## 2012-04-21 NOTE — Progress Notes (Signed)
Pt presented for bi-weekly testosterone injection.  This is tolerated well in the left gluteal. 

## 2012-04-29 ENCOUNTER — Other Ambulatory Visit: Payer: Self-pay | Admitting: Family Medicine

## 2012-04-29 ENCOUNTER — Telehealth: Payer: Self-pay | Admitting: *Deleted

## 2012-04-29 MED ORDER — ATORVASTATIN CALCIUM 40 MG PO TABS: 40.0000 mg | ORAL_TABLET | Freq: Every day | ORAL | Status: DC

## 2012-04-29 NOTE — Telephone Encounter (Signed)
After reviewing patient chart Dr. Marvel Plan changed pateint medication Lipitor generic atorvastatin to 40mg  one tablet daily.

## 2012-05-05 ENCOUNTER — Ambulatory Visit (INDEPENDENT_AMBULATORY_CARE_PROVIDER_SITE_OTHER): Payer: BC Managed Care – PPO | Admitting: *Deleted

## 2012-05-05 ENCOUNTER — Encounter: Payer: BC Managed Care – PPO | Admitting: Gastroenterology

## 2012-05-05 DIAGNOSIS — E291 Testicular hypofunction: Secondary | ICD-10-CM

## 2012-05-05 MED ORDER — TESTOSTERONE CYPIONATE 200 MG/ML IM SOLN
200.0000 mg | INTRAMUSCULAR | Status: DC
  Administered 2012-05-05: 200 mg via INTRAMUSCULAR

## 2012-05-05 NOTE — Progress Notes (Signed)
Pt presented for bi-weekly testosterone injection.  This is tolerated well in the left gluteal.  He will return in two weeks for next injection.

## 2012-05-12 ENCOUNTER — Encounter: Payer: Self-pay | Admitting: Family Medicine

## 2012-05-12 ENCOUNTER — Ambulatory Visit (INDEPENDENT_AMBULATORY_CARE_PROVIDER_SITE_OTHER): Payer: BC Managed Care – PPO | Admitting: Family Medicine

## 2012-05-12 VITALS — BP 124/82 | HR 83 | Temp 98.6°F | Ht 70.0 in | Wt 199.0 lb

## 2012-05-12 DIAGNOSIS — B349 Viral infection, unspecified: Secondary | ICD-10-CM | POA: Insufficient documentation

## 2012-05-12 DIAGNOSIS — Z23 Encounter for immunization: Secondary | ICD-10-CM

## 2012-05-12 DIAGNOSIS — B9789 Other viral agents as the cause of diseases classified elsewhere: Secondary | ICD-10-CM

## 2012-05-12 DIAGNOSIS — J029 Acute pharyngitis, unspecified: Secondary | ICD-10-CM

## 2012-05-12 MED ORDER — AMOXICILLIN 875 MG PO TABS: 875.0000 mg | ORAL_TABLET | Freq: Two times a day (BID) | ORAL | Status: DC

## 2012-05-12 NOTE — Assessment & Plan Note (Signed)
Very early in the course of things. Sent group A strep culture. Since he is going to [place] in 4d I did rx amoxil for him to fill and hold--take if sx's lasting >7d or if worsening focal sx's prior to this. Discussed risks of inappropriate abx use, pt expressed understanding and does want to proceed with this course of action. I do not think he will take the abx unless his illness meets the criteria I layed out for him today. Call or return prior to leaving for [place] if new questions.
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
## 2012-05-14 LAB — CULTURE, GROUP A STREP: Organism ID, Bacteria: NORMAL

## 2012-05-15 ENCOUNTER — Ambulatory Visit (INDEPENDENT_AMBULATORY_CARE_PROVIDER_SITE_OTHER): Payer: BC Managed Care – PPO | Admitting: *Deleted

## 2012-05-15 DIAGNOSIS — E291 Testicular hypofunction: Secondary | ICD-10-CM

## 2012-05-15 MED ORDER — TESTOSTERONE CYPIONATE 200 MG/ML IM SOLN
200.0000 mg | INTRAMUSCULAR | Status: DC
  Administered 2012-05-15: 200 mg via INTRAMUSCULAR

## 2012-05-15 NOTE — Progress Notes (Signed)
Pt presented for bi-weekly testosterone injection.  This is tolerated well in the right gluteal.

## 2012-06-03 ENCOUNTER — Encounter: Payer: Self-pay | Admitting: Gastroenterology

## 2012-06-10 ENCOUNTER — Ambulatory Visit (AMBULATORY_SURGERY_CENTER): Payer: BC Managed Care – PPO

## 2012-06-10 VITALS — Ht 70.0 in | Wt 203.4 lb

## 2012-06-10 DIAGNOSIS — Z1211 Encounter for screening for malignant neoplasm of colon: Secondary | ICD-10-CM

## 2012-06-10 MED ORDER — MOVIPREP 100 G PO SOLR: ORAL | Status: DC

## 2012-06-11 ENCOUNTER — Encounter: Payer: Self-pay | Admitting: Gastroenterology

## 2012-06-23 ENCOUNTER — Ambulatory Visit (AMBULATORY_SURGERY_CENTER): Payer: BC Managed Care – PPO | Admitting: Gastroenterology

## 2012-06-23 ENCOUNTER — Encounter: Payer: Self-pay | Admitting: Gastroenterology

## 2012-06-23 VITALS — BP 102/76 | HR 76 | Temp 96.7°F | Resp 15 | Ht 70.0 in | Wt 203.0 lb

## 2012-06-23 DIAGNOSIS — Z1211 Encounter for screening for malignant neoplasm of colon: Secondary | ICD-10-CM

## 2012-06-23 HISTORY — PX: COLONOSCOPY: SHX174

## 2012-06-23 MED ORDER — SODIUM CHLORIDE 0.9 % IV SOLN: 500.0000 mL | INTRAVENOUS | Status: DC

## 2012-06-23 NOTE — Progress Notes (Signed)
NO EGG OR SOY ALLERGY, NO PROBLEMS WITH SEDATION IN THE PAST. EWM

## 2012-06-23 NOTE — Progress Notes (Signed)
Patient did not experience any of the following events: a burn prior to discharge; a fall within the facility; wrong site/side/patient/procedure/implant event; or a hospital transfer or hospital admission upon discharge from the facility. (G8907) Patient did not have preoperative order for IV antibiotic SSI prophylaxis. (G8918)  

## 2012-06-23 NOTE — Op Note (Signed)
[facility] [address] [place], [address]   COLONOSCOPY PROCEDURE REPORT  PATIENT: Aaron Adams, Aaron Adams  MR#: [id number] BIRTHDATE: 1956/04/05 , 56  yrs. old GENDER: Male ENDOSCOPIST: Mardella Layman, MD, [facility] REFERRED BY:  Earley Favor, M.D. PROCEDURE DATE:  06/23/2012 PROCEDURE:   Colonoscopy, screening ASA CLASS:   Class II INDICATIONS:Average risk patient for colon cancer. MEDICATIONS: propofol (Diprivan) 400mg  IV  DESCRIPTION OF PROCEDURE:   After the risks and benefits and of the procedure were explained, informed consent was obtained.  A digital rectal exam revealed no abnormalities of the rectum.    The LB [id number]-[id number]  endoscope was introduced through the anus and advanced to the cecum, which was identified by both the appendix and ileocecal valve .  The quality of the prep was excellent, using MoviPrep .  The instrument was then slowly withdrawn as the colon was fully examined.     COLON FINDINGS: A normal appearing cecum, ileocecal valve, and appendiceal orifice were identified.  The ascending, hepatic flexure, transverse, splenic flexure, descending, sigmoid colon and rectum appeared unremarkable.  No polyps or cancers were seen. Retroflexed views revealed no abnormalities.     The scope was then withdrawn from the patient and the procedure completed.  COMPLICATIONS: There were no complications. ENDOSCOPIC IMPRESSION: Normal colon ...no polyps noted....  RECOMMENDATIONS: Continue current colorectal screening recommendations for "routine risk" patients with a repeat colonoscopy in 10 years.   REPEAT EXAM:  cc:  _______________________________ eSignedMardella Layman, MD, [facility] 06/23/2012 9:16 AM

## 2012-06-23 NOTE — Patient Instructions (Addendum)
YOU HAD AN ENDOSCOPIC PROCEDURE TODAY AT THE [facility]: Refer to the procedure report that was given to you for any specific questions about what was found during the examination.  If the procedure report does not answer your questions, please call your gastroenterologist to clarify.  If you requested that your care partner not be given the details of your procedure findings, then the procedure report has been included in a sealed envelope for you to review at your convenience later.  YOU SHOULD EXPECT: Some feelings of bloating in the abdomen. Passage of more gas than usual.  Walking can help get rid of the air that was put into your GI tract during the procedure and reduce the bloating. If you had a lower endoscopy (such as a colonoscopy or flexible sigmoidoscopy) you may notice spotting of blood in your stool or on the toilet paper. If you underwent a bowel prep for your procedure, then you may not have a normal bowel movement for a few days.  DIET: Your first meal following the procedure should be a light meal and then it is ok to progress to your normal diet.  A half-sandwich or bowl of soup is an example of a good first meal.  Heavy or fried foods are harder to digest and may make you feel nauseous or bloated.  Likewise meals heavy in dairy and vegetables can cause extra gas to form and this can also increase the bloating.  Drink plenty of fluids but you should avoid alcoholic beverages for 24 hours.  ACTIVITY: Your care partner should take you home directly after the procedure.  You should plan to take it easy, moving slowly for the rest of the day.  You can resume normal activity the day after the procedure however you should NOT DRIVE or use heavy machinery for 24 hours (because of the sedation medicines used during the test).    SYMPTOMS TO REPORT IMMEDIATELY: A gastroenterologist can be reached at any hour.  During normal business hours, 8:30 AM to 5:00 PM Monday through Friday,  call [phone].  After hours and on weekends, please call the GI answering service at [phone] who will take a message and have the physician on call contact you.   Following lower endoscopy (colonoscopy or flexible sigmoidoscopy):  Excessive amounts of blood in the stool  Significant tenderness or worsening of abdominal pains  Swelling of the abdomen that is new, acute  Fever of 100F or higher  Following upper endoscopy (EGD)   FOLLOW UP: If any biopsies were taken you will be contacted by phone or by letter within the next 1-3 weeks.  Call your gastroenterologist if you have not heard about the biopsies in 3 weeks.  Our staff will call the home number listed on your records the next business day following your procedure to check on you and address any questions or concerns that you may have at that time regarding the information given to you following your procedure. This is a courtesy call and so if there is no answer at the home number and we have not heard from you through the emergency physician on call, we will assume that you have returned to your regular daily activities without incident.  SIGNATURES/CONFIDENTIALITY: You and/or your care partner have signed paperwork which will be entered into your electronic medical record.  These signatures attest to the fact that that the information above on your After Visit Summary has been reviewed and is understood.  Full  responsibility of the confidentiality of this discharge information lies with you and/or your care-partner.    Normal colonoscopy.  Repeat colonoscopy in 10 years-2024

## 2012-06-24 ENCOUNTER — Telehealth: Payer: Self-pay | Admitting: *Deleted

## 2012-06-24 NOTE — Telephone Encounter (Signed)
Number identifier, left message, follow-up  

## 2012-06-25 ENCOUNTER — Encounter: Payer: Self-pay | Admitting: Family Medicine

## 2012-08-25 ENCOUNTER — Ambulatory Visit (INDEPENDENT_AMBULATORY_CARE_PROVIDER_SITE_OTHER): Payer: BC Managed Care – PPO | Admitting: Family Medicine

## 2012-08-25 ENCOUNTER — Encounter: Payer: Self-pay | Admitting: *Deleted

## 2012-08-25 ENCOUNTER — Telehealth: Payer: Self-pay | Admitting: Family Medicine

## 2012-08-25 ENCOUNTER — Encounter: Payer: Self-pay | Admitting: Family Medicine

## 2012-08-25 VITALS — BP 129/87 | HR 82 | Temp 98.2°F | Resp 16 | Wt 205.2 lb

## 2012-08-25 DIAGNOSIS — Z23 Encounter for immunization: Secondary | ICD-10-CM

## 2012-08-25 DIAGNOSIS — R5381 Other malaise: Secondary | ICD-10-CM

## 2012-08-25 DIAGNOSIS — Z111 Encounter for screening for respiratory tuberculosis: Secondary | ICD-10-CM

## 2012-08-25 DIAGNOSIS — R221 Localized swelling, mass and lump, neck: Secondary | ICD-10-CM | POA: Insufficient documentation

## 2012-08-25 DIAGNOSIS — R5383 Other fatigue: Secondary | ICD-10-CM

## 2012-08-25 DIAGNOSIS — R220 Localized swelling, mass and lump, head: Secondary | ICD-10-CM

## 2012-08-25 LAB — CBC WITH DIFFERENTIAL/PLATELET
Basophils Absolute: 0 10*3/uL (ref 0.0–0.1)
Basophils Relative: 0.5 % (ref 0.0–3.0)
Eosinophils Absolute: 0.3 10*3/uL (ref 0.0–0.7)
Hemoglobin: 15.5 g/dL (ref 13.0–17.0)
Lymphocytes Relative: 29 % (ref 12.0–46.0)
MCHC: 33.8 g/dL (ref 30.0–36.0)
Monocytes Relative: 7.2 % (ref 3.0–12.0)
Neutro Abs: 4.4 10*3/uL (ref 1.4–7.7)
Neutrophils Relative %: 59.7 % (ref 43.0–77.0)
RBC: 5.1 Mil/uL (ref 4.22–5.81)
WBC: 7.4 10*3/uL (ref 4.5–10.5)

## 2012-08-25 MED ORDER — AMOXICILLIN-POT CLAVULANATE 875-125 MG PO TABS: 1.0000 | ORAL_TABLET | Freq: Two times a day (BID) | ORAL | Status: DC

## 2012-08-25 NOTE — Telephone Encounter (Signed)
Patient Information:  Caller Name: [place]  Phone: [phone]  Patient: Aaron Adams, Aaron Adams  Gender: Male  DOB: 1956-03-02  Age: 57 Years  PCP: Earley Favor [facility])  Office Follow Up:  Does the office need to follow up with this patient?: No  Instructions For The Office: N/A   Symptoms  Reason For Call & Symptoms: Patient states he has a lymph node under his Left  jaw that is swollen. Onset yesterday. He states discomfort with swallowing. There is no sore throat but pain with swallowing. No difficulty breathing. Afebrile. Feels tired and weak. Size of quarter and visably noticeable. Seen by Dentist for six month cleaning last week.  Reviewed Health History In EMR: Yes  Reviewed Medications In EMR: Yes  Reviewed Allergies In EMR: Yes  Reviewed Surgeries / Procedures: Yes  Date of Onset of Symptoms: 08/24/2012  Treatments Tried: Aleve  Treatments Tried Worked: No  Guideline(s) Used:  Lymph Nodes - Swollen  Disposition Per Guideline:   See Today or Tomorrow in Office  Reason For Disposition Reached:   Very tender to the touch but no fever  Advice Given:  Swollen Lymph Nodes from a Viral Infection:  Viral throat infections and colds can cause lymph nodes in the neck to double in size. Slight enlargement and mild tenderness means the lymph node is fighting the infection and doing a good job.  Expected Course: After the infection is gone, the nodes slowly return to normal size over 1 to 2 weeks. However, they will not ever completely disappear.  Contagiousness: Swollen lymph nodes are not contagious.  Pain and Fever Medicines:  For pain or fever relief, take either acetaminophen or ibuprofen.  They are over-the-counter (OTC) drugs that help treat both fever and pain. You can buy them at the drugstore.  Treat fevers above 101 F (38.3 C). The goal of fever therapy is to bring the fever down to a comfortable level. Remember that fever medicine usually lowers fever 2  degrees F (1 - 1 1/2 degrees C).  Ibuprofen (e.g., Motrin, Advil):  Another choice is to take 600 mg (three 200 mg pills) by mouth every 8 hours.  Call Back If:  Node enlarges to more than 1 inch (2.5 cm) in size  Overlying skin becomes red  Fever more than 103 F (39.4 C) occurs  You become worse or are worried about a lymph node.  Patient Will Follow Care Advice:  YES  Appointment Scheduled:  08/25/2012 15:00:00 Appointment Scheduled Provider:  Earley Favor [facility])

## 2012-08-25 NOTE — Assessment & Plan Note (Signed)
Lymph node versus submandibular gland enlargement, suspicious for infection. Augmentin 875/125, 1 bid x 10d. Check CBC w/diff.   PPD test placed in office today. Recheck in 3d and if not improved will proceed with neck CT to further evaluate. I don't think his recent tick bite has anything to do with this, but will keep this in mind as we follow his sx's over the next few days.

## 2012-08-25 NOTE — Telephone Encounter (Signed)
Just an FYI

## 2012-08-25 NOTE — Telephone Encounter (Signed)
Pt seen in office today by myself for this problem.
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
## 2012-08-28 ENCOUNTER — Ambulatory Visit: Payer: BC Managed Care – PPO | Admitting: Family Medicine

## 2012-08-28 ENCOUNTER — Ambulatory Visit (INDEPENDENT_AMBULATORY_CARE_PROVIDER_SITE_OTHER): Payer: BC Managed Care – PPO | Admitting: Family Medicine

## 2012-08-28 ENCOUNTER — Encounter: Payer: Self-pay | Admitting: Family Medicine

## 2012-08-28 VITALS — BP 132/86 | HR 85 | Temp 98.3°F | Resp 16 | Wt 203.2 lb

## 2012-08-28 DIAGNOSIS — R599 Enlarged lymph nodes, unspecified: Secondary | ICD-10-CM

## 2012-08-28 DIAGNOSIS — K121 Other forms of stomatitis: Secondary | ICD-10-CM

## 2012-08-28 DIAGNOSIS — R590 Localized enlarged lymph nodes: Secondary | ICD-10-CM

## 2012-08-28 DIAGNOSIS — K1370 Unspecified lesions of oral mucosa: Secondary | ICD-10-CM

## 2012-08-28 DIAGNOSIS — R5383 Other fatigue: Secondary | ICD-10-CM

## 2012-08-28 DIAGNOSIS — R5381 Other malaise: Secondary | ICD-10-CM

## 2012-08-28 DIAGNOSIS — R21 Rash and other nonspecific skin eruption: Secondary | ICD-10-CM

## 2012-08-28 DIAGNOSIS — E291 Testicular hypofunction: Secondary | ICD-10-CM

## 2012-08-28 LAB — TSH: TSH: 1.12 u[IU]/mL (ref 0.35–5.50)

## 2012-08-28 LAB — TB SKIN TEST: TB Skin Test: NEGATIVE

## 2012-08-28 LAB — MONONUCLEOSIS SCREEN: Mono Screen: NEGATIVE
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
## 2012-09-09 ENCOUNTER — Ambulatory Visit (INDEPENDENT_AMBULATORY_CARE_PROVIDER_SITE_OTHER): Payer: BC Managed Care – PPO | Admitting: Family Medicine

## 2012-09-09 ENCOUNTER — Encounter: Payer: Self-pay | Admitting: Family Medicine

## 2012-09-09 VITALS — BP 138/94 | HR 84 | Temp 98.3°F | Resp 16 | Ht 70.0 in | Wt 200.5 lb

## 2012-09-09 DIAGNOSIS — G4710 Hypersomnia, unspecified: Secondary | ICD-10-CM

## 2012-09-09 DIAGNOSIS — R220 Localized swelling, mass and lump, head: Secondary | ICD-10-CM

## 2012-09-09 DIAGNOSIS — R5382 Chronic fatigue, unspecified: Secondary | ICD-10-CM

## 2012-09-09 DIAGNOSIS — R221 Localized swelling, mass and lump, neck: Secondary | ICD-10-CM

## 2012-09-09 DIAGNOSIS — R5381 Other malaise: Secondary | ICD-10-CM

## 2012-09-09 NOTE — Assessment & Plan Note (Signed)
With excessive daytime somnolence, hx of OSA.  He did not tolerate CPAP--too smothered/claustrophobic.   Since his sleep study was approx 7 yrs ago I think we need to repeat his test.  If positive for significant OSA again, would refer to sleep specialist for review of alternatives to CPAP for treatment of OSA.  Additionally, we'll do a 2 week trial off of his statin (atorvastatin).  He seems to think maybe his fatigue did get worse with switch of this from zocor to lipitor a few months ago. I also recommended that he get into a routine of exercising, starting with one day a week for and working his way up over the next several months.
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
## 2012-09-09 NOTE — Assessment & Plan Note (Signed)
I think this was either bact LAD or reactive LAD--it is resolving appropriately and he is finished with abx. Will monitor at each exam.

## 2012-09-16 ENCOUNTER — Other Ambulatory Visit: Payer: Self-pay | Admitting: Family Medicine

## 2012-09-16 DIAGNOSIS — G4733 Obstructive sleep apnea (adult) (pediatric): Secondary | ICD-10-CM

## 2012-09-22 ENCOUNTER — Other Ambulatory Visit: Payer: Self-pay | Admitting: Family Medicine

## 2012-09-23 NOTE — Telephone Encounter (Signed)
Rx request to pharmacy/SLS  

## 2012-10-06 ENCOUNTER — Telehealth: Payer: Self-pay | Admitting: *Deleted

## 2012-10-06 DIAGNOSIS — G4733 Obstructive sleep apnea (adult) (pediatric): Secondary | ICD-10-CM

## 2012-10-06 NOTE — Telephone Encounter (Signed)
Pt decided on pulm referral.  Sleep study canceled.   I ordered pulm referral today.

## 2012-10-06 NOTE — Telephone Encounter (Signed)
Pls call pt and tell him that my plan was to redo his sleep study to make sure he still has OSA.  Then, if he did have OSA still then I would have the pulmonologist see him for discussion of alternatives to CPAP. However, I think it is fine if he would rather do the pulmonologist consult FIRST, but just let him know that the pulmonologist MIGHT suggest repeating his sleep study anyway.  If this is what he prefers, then please call the sleep lab and cancel sleep study and apologize for me.  Let me know-thx

## 2012-10-06 NOTE — Telephone Encounter (Signed)
[place] w/[facility] would like clarification on pt's Sleep Study order, he has appt tomorrow evening for CPAP Titration, Tues, October 07, 2012 and when called to verify appointment patient states that he was under the impression he was going there to have other alternatives discussed with him/SLS

## 2012-10-06 NOTE — Telephone Encounter (Signed)
Spoke w/patient, he prefers to see Pulmonologist first for consult on CPAP alternatives; called [facility] back & left message for Aaron Adams to cancel pt's scheduled appt for Tues, 06.03.14, as we had discussed earlier via telephone/SLS

## 2012-10-07 ENCOUNTER — Encounter (HOSPITAL_BASED_OUTPATIENT_CLINIC_OR_DEPARTMENT_OTHER): Payer: BC Managed Care – PPO

## 2012-10-28 ENCOUNTER — Institutional Professional Consult (permissible substitution): Payer: BC Managed Care – PPO | Admitting: Pulmonary Disease

## 2012-11-11 ENCOUNTER — Ambulatory Visit (INDEPENDENT_AMBULATORY_CARE_PROVIDER_SITE_OTHER): Payer: BC Managed Care – PPO | Admitting: Family Medicine

## 2012-11-11 ENCOUNTER — Encounter: Payer: Self-pay | Admitting: Family Medicine

## 2012-11-11 VITALS — BP 115/76 | HR 81 | Temp 97.7°F | Resp 16 | Ht 70.0 in | Wt 194.0 lb

## 2012-11-11 DIAGNOSIS — E785 Hyperlipidemia, unspecified: Secondary | ICD-10-CM

## 2012-11-11 DIAGNOSIS — R5382 Chronic fatigue, unspecified: Secondary | ICD-10-CM

## 2012-11-11 DIAGNOSIS — R5381 Other malaise: Secondary | ICD-10-CM

## 2012-11-11 DIAGNOSIS — R5383 Other fatigue: Secondary | ICD-10-CM

## 2012-11-11 LAB — LIPID PANEL: Cholesterol: 130 mg/dL (ref 0–200)

## 2012-11-11 MED ORDER — CARISOPRODOL 350 MG PO TABS: 350.0000 mg | ORAL_TABLET | Freq: Four times a day (QID) | ORAL | Status: DC | PRN

## 2012-11-11 NOTE — Assessment & Plan Note (Signed)
Lab Results  Component Value Date   CHOL 224* 04/11/2012   HDL 44.70 04/11/2012   LDLCALC 110* 09/15/2008   LDLDIRECT 161.3 04/11/2012   TRIG 186.0* 04/11/2012   CHOLHDL 5 04/11/2012   We'll recheck FLP today, esp in light of his being on his atorv 40 qod instead of qd for the last 2 months.

## 2012-11-11 NOTE — Assessment & Plan Note (Signed)
Improved lately. He does have periods that are very compatible with dx of fibromyalgia syndrome. Continue with as much activity/exercise as possible, continue on qod 40mg  atorvastatin for now--check FLP today. He also requests testing for lyme dz due to hx of lots of tick bites+ his conglomeration of sx's over the years---he wonders about whether this is being missed.  Lyme ab titers ordered today.
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
## 2012-11-12 LAB — B. BURGDORFI ANTIBODIES: B burgdorferi Ab IgG+IgM: 0.27 {ISR}

## 2012-12-19 ENCOUNTER — Other Ambulatory Visit: Payer: Self-pay | Admitting: Family Medicine

## 2012-12-19 NOTE — Telephone Encounter (Signed)
Denied, requested rx to soon.

## 2012-12-25 ENCOUNTER — Telehealth: Payer: Self-pay | Admitting: *Deleted

## 2012-12-25 MED ORDER — CARISOPRODOL 350 MG PO TABS: ORAL_TABLET | ORAL | Status: DC

## 2012-12-25 NOTE — Telephone Encounter (Signed)
Rx printed. Pls tell pt that this med is not meant to be taken four times per day on a chronic basis. I want him to cut back to a maximum of 1 tab twice per day, so #60 should last a month.  Thx

## 2012-12-25 NOTE — Telephone Encounter (Signed)
Please advise 

## 2012-12-26 NOTE — Telephone Encounter (Signed)
Rx faxed to pharmacy. Patient aware.  

## 2012-12-29 ENCOUNTER — Other Ambulatory Visit: Payer: Self-pay | Admitting: Family Medicine

## 2012-12-29 MED ORDER — TESTOSTERONE CYPIONATE 200 MG/ML IM SOLN: 200.0000 mg | INTRAMUSCULAR | Status: DC

## 2013-01-16 ENCOUNTER — Other Ambulatory Visit: Payer: Self-pay | Admitting: Family Medicine

## 2013-01-16 MED ORDER — ATORVASTATIN CALCIUM 40 MG PO TABS: 40.0000 mg | ORAL_TABLET | Freq: Every day | ORAL | Status: DC

## 2013-01-20 ENCOUNTER — Other Ambulatory Visit: Payer: Self-pay | Admitting: Family Medicine

## 2013-01-20 MED ORDER — ATORVASTATIN CALCIUM 40 MG PO TABS: 40.0000 mg | ORAL_TABLET | Freq: Every day | ORAL | Status: DC

## 2013-03-11 ENCOUNTER — Other Ambulatory Visit: Payer: Self-pay | Admitting: Family Medicine

## 2013-03-11 MED ORDER — LISINOPRIL 40 MG PO TABS: ORAL_TABLET | ORAL | Status: DC

## 2013-03-16 ENCOUNTER — Other Ambulatory Visit (HOSPITAL_BASED_OUTPATIENT_CLINIC_OR_DEPARTMENT_OTHER): Payer: Self-pay | Admitting: Neurosurgery

## 2013-03-16 DIAGNOSIS — M5412 Radiculopathy, cervical region: Secondary | ICD-10-CM

## 2013-03-18 ENCOUNTER — Encounter: Payer: BC Managed Care – PPO | Admitting: Family Medicine

## 2013-03-18 DIAGNOSIS — Z0289 Encounter for other administrative examinations: Secondary | ICD-10-CM

## 2013-03-23 ENCOUNTER — Ambulatory Visit (INDEPENDENT_AMBULATORY_CARE_PROVIDER_SITE_OTHER): Payer: BC Managed Care – PPO | Admitting: Family Medicine

## 2013-03-23 ENCOUNTER — Encounter: Payer: Self-pay | Admitting: Family Medicine

## 2013-03-23 ENCOUNTER — Ambulatory Visit: Payer: BC Managed Care – PPO | Admitting: Family Medicine

## 2013-03-23 VITALS — BP 132/88 | HR 78 | Temp 98.9°F | Resp 18 | Ht 70.0 in | Wt 198.0 lb

## 2013-03-23 DIAGNOSIS — K645 Perianal venous thrombosis: Secondary | ICD-10-CM | POA: Insufficient documentation

## 2013-03-23 MED ORDER — HYDROCORTISONE 2.5 % RE CREA: 1.0000 "application " | TOPICAL_CREAM | Freq: Two times a day (BID) | RECTAL | Status: DC

## 2013-03-23 NOTE — Assessment & Plan Note (Signed)
On exam this definitedly appears to be an external hemorrhoid.   However, the lack of pain is more consistent with a prolapsed internal hemorrhoid. Will proceed with Anusol HC 2.5% cream bid, sitz bath bid. If not improving any in 1 wk, patient will let me know and/or go to his GI MD for further eval.
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
## 2013-03-24 ENCOUNTER — Other Ambulatory Visit: Payer: BC Managed Care – PPO

## 2013-03-24 ENCOUNTER — Ambulatory Visit (INDEPENDENT_AMBULATORY_CARE_PROVIDER_SITE_OTHER): Payer: BC Managed Care – PPO

## 2013-03-24 DIAGNOSIS — M5412 Radiculopathy, cervical region: Secondary | ICD-10-CM

## 2013-03-24 DIAGNOSIS — M542 Cervicalgia: Secondary | ICD-10-CM

## 2013-03-25 ENCOUNTER — Other Ambulatory Visit: Payer: Self-pay | Admitting: Family Medicine

## 2013-03-25 NOTE — Telephone Encounter (Signed)
Refill request for Soma Last filled by MD on - 12/25/12 #60 x3 Last AEX - 03/23/13 Next AEX - none Please advised refill?

## 2013-04-06 ENCOUNTER — Telehealth: Payer: Self-pay | Admitting: Family Medicine

## 2013-04-06 ENCOUNTER — Other Ambulatory Visit: Payer: Self-pay | Admitting: Family Medicine

## 2013-04-06 DIAGNOSIS — K645 Perianal venous thrombosis: Secondary | ICD-10-CM

## 2013-04-06 NOTE — Telephone Encounter (Signed)
Even though that doesn't make sense, it doesn't matter because the more appropriate referral is to general surgery. Pls notify pt that I've entered the referral order and he will be getting contacted about appt.-thx

## 2013-04-06 NOTE — Telephone Encounter (Signed)
GI scheduler told me patient needs a referral in order to see dr.

## 2013-04-06 NOTE — Telephone Encounter (Signed)
Pls call [facility] tell them he is an established pt there with Dr. Jarold Motto. Get him in for appt for evaluation of hemorrhoids with Dr. Jarold Motto or as a work-in with any available provider (whichever gets him in faster)-thx.

## 2013-04-06 NOTE — Telephone Encounter (Signed)
Patient aware.

## 2013-04-06 NOTE — Telephone Encounter (Signed)
Patient is requesting GI referral which was discussed in OV 2 weeks ago. He is not getting any better. He is already established with [facility].

## 2013-04-06 NOTE — Telephone Encounter (Signed)
Please advise 

## 2013-04-07 ENCOUNTER — Ambulatory Visit (INDEPENDENT_AMBULATORY_CARE_PROVIDER_SITE_OTHER): Payer: BC Managed Care – PPO | Admitting: Surgery

## 2013-04-07 ENCOUNTER — Encounter (INDEPENDENT_AMBULATORY_CARE_PROVIDER_SITE_OTHER): Payer: Self-pay | Admitting: Surgery

## 2013-04-07 VITALS — BP 122/82 | HR 78 | Temp 97.6°F | Resp 18 | Ht 70.0 in | Wt 198.0 lb

## 2013-04-07 DIAGNOSIS — K645 Perianal venous thrombosis: Secondary | ICD-10-CM
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
## 2013-04-07 NOTE — Patient Instructions (Signed)
ANORECTAL PROCEDURES: 1.  Tub soaks 2-3 times daily in warm water (may add Epsom salts if desired) 2.  Stool softener for one month (store brand Miralax or Colace) 3.  Avoid toilet paper - use baby wipes or Tucks pads 4.  Increase water intake - 6-8 glasses daily 5.  Apply dry pad to area until drainage stops 

## 2013-06-15 ENCOUNTER — Other Ambulatory Visit: Payer: Self-pay | Admitting: Family Medicine

## 2013-06-15 MED ORDER — ATORVASTATIN CALCIUM 40 MG PO TABS: 40.0000 mg | ORAL_TABLET | Freq: Every day | ORAL | Status: DC

## 2013-08-26 ENCOUNTER — Telehealth: Payer: Self-pay | Admitting: Family Medicine

## 2013-08-26 ENCOUNTER — Other Ambulatory Visit: Payer: Self-pay | Admitting: Family Medicine

## 2013-08-26 NOTE — Telephone Encounter (Signed)
Patient scheduled an appt 09/24/13. Closing phone note

## 2013-08-26 NOTE — Telephone Encounter (Signed)
Patient requesting refill of his testosterone.  Patient last seen 03/23/13.  Last testosterone lab was 08/28/12.  Please advise refill.

## 2013-08-26 NOTE — Telephone Encounter (Signed)
Testosterone rx printed per pt request. Pls notify pt that it is time for him to come in for routine CPE with fasting labs--sometime in the next 1-3 months is fine.-thx

## 2013-08-31 ENCOUNTER — Telehealth: Payer: Self-pay

## 2013-08-31 NOTE — Telephone Encounter (Signed)
No rx med to be called in. If he feels like it is his allergies causing the problem then I recommend he try taking OTC nasocort nasal spray once daily and OTC generic zyrtec 10mg  once daily. He can take OTC generic delsym as a cough suppressant if he still finds that the cough is bothering him. Otherwise, he will need to come in for evaluation before any rx meds given.-thx

## 2013-08-31 NOTE — Telephone Encounter (Signed)
Pt left voicemail wanting to see if Dr Anitra Lauth can send in an Rx for cough medicine. Please advise. He states it is his allergies triggering the cough.

## 2013-08-31 NOTE — Telephone Encounter (Signed)
LMOM to CB. 

## 2013-09-01 NOTE — Telephone Encounter (Signed)
Patient has been advised

## 2013-09-08 ENCOUNTER — Other Ambulatory Visit: Payer: Self-pay | Admitting: *Deleted

## 2013-09-08 MED ORDER — LISINOPRIL 40 MG PO TABS
ORAL_TABLET | ORAL | Status: DC
Start: 2013-09-08 — End: 2013-09-16

## 2013-09-16 ENCOUNTER — Telehealth: Payer: Self-pay | Admitting: Family Medicine

## 2013-09-16 MED ORDER — LISINOPRIL 40 MG PO TABS: ORAL_TABLET | ORAL | Status: DC

## 2013-09-16 NOTE — Telephone Encounter (Signed)
Patient request that Lisinopril Rx be sent to [facility]. Patient only uses [facility] when he is coming from our office. Thanks.

## 2013-09-16 NOTE — Telephone Encounter (Signed)
Rx sent into [facility].  # 90 no refills.

## 2013-09-24 ENCOUNTER — Encounter: Payer: Self-pay | Admitting: Family Medicine

## 2013-09-24 ENCOUNTER — Ambulatory Visit (INDEPENDENT_AMBULATORY_CARE_PROVIDER_SITE_OTHER): Payer: BC Managed Care – PPO | Admitting: Family Medicine

## 2013-09-24 VITALS — BP 127/85 | HR 78 | Temp 98.1°F | Ht 70.0 in | Wt 199.0 lb

## 2013-09-24 DIAGNOSIS — Z125 Encounter for screening for malignant neoplasm of prostate: Secondary | ICD-10-CM

## 2013-09-24 DIAGNOSIS — Z23 Encounter for immunization: Secondary | ICD-10-CM

## 2013-09-24 DIAGNOSIS — Z0000 Encounter for general adult medical examination without abnormal findings: Secondary | ICD-10-CM

## 2013-09-24 LAB — COMPREHENSIVE METABOLIC PANEL
ALT: 36 U/L (ref 0–53)
AST: 27 U/L (ref 0–37)
Albumin: 4.3 g/dL (ref 3.5–5.2)
Alkaline Phosphatase: 49 U/L (ref 39–117)
BILIRUBIN TOTAL: 0.4 mg/dL (ref 0.2–1.2)
BUN: 11 mg/dL (ref 6–23)
CO2: 33 meq/L — AB (ref 19–32)
CREATININE: 1 mg/dL (ref 0.4–1.5)
Calcium: 9.7 mg/dL (ref 8.4–10.5)
Chloride: 98 mEq/L (ref 96–112)
GFR: 80.14 mL/min (ref >60.00)
Glucose, Bld: 87 mg/dL (ref 70–99)
Potassium: 4.2 mEq/L (ref 3.5–5.1)
Sodium: 138 mEq/L (ref 135–145)
TOTAL PROTEIN: 7.1 g/dL (ref 6.0–8.3)

## 2013-09-24 LAB — LIPID PANEL
CHOL/HDL RATIO: 4
CHOLESTEROL: 173 mg/dL (ref 0–200)
HDL: 43.4 mg/dL (ref >39.00)
LDL Cholesterol: 104 mg/dL — ABNORMAL HIGH (ref 0–99)
TRIGLYCERIDES: 129 mg/dL (ref 0.0–149.0)
VLDL: 25.8 mg/dL (ref 0.0–40.0)

## 2013-09-24 LAB — CBC WITH DIFFERENTIAL/PLATELET
BASOS PCT: 0.5 % (ref 0.0–3.0)
Basophils Absolute: 0 10*3/uL (ref 0.0–0.1)
EOS PCT: 1.6 % (ref 0.0–5.0)
Eosinophils Absolute: 0.1 10*3/uL (ref 0.0–0.7)
HEMATOCRIT: 50.4 % (ref 39.0–52.0)
HEMOGLOBIN: 17 g/dL (ref 13.0–17.0)
LYMPHS ABS: 1.9 10*3/uL (ref 0.7–4.0)
Lymphocytes Relative: 30.7 % (ref 12.0–46.0)
MCHC: 33.8 g/dL (ref 30.0–36.0)
MCV: 94.9 fl (ref 78.0–100.0)
MONOS PCT: 8.6 % (ref 3.0–12.0)
Monocytes Absolute: 0.5 10*3/uL (ref 0.1–1.0)
NEUTROS ABS: 3.7 10*3/uL (ref 1.4–7.7)
Neutrophils Relative %: 58.6 % (ref 43.0–77.0)
Platelets: 205 10*3/uL (ref 150.0–400.0)
RBC: 5.31 Mil/uL (ref 4.22–5.81)
RDW: 15 % (ref 11.5–15.5)
WBC: 6.3 10*3/uL (ref 4.0–10.5)

## 2013-09-24 LAB — PSA: PSA: 0.92 ng/mL (ref 0.10–4.00)

## 2013-09-24 LAB — TSH: TSH: 0.51 u[IU]/mL (ref 0.35–4.50)
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
## 2013-09-24 NOTE — Assessment & Plan Note (Addendum)
Reviewed age and gender appropriate health maintenance issues (prudent diet, regular exercise, health risks of tobacco and excessive alcohol, use of seatbelts, fire alarms in home, use of sunscreen).  Also reviewed age and gender appropriate health screening as well as vaccine recommendations. HP labs today. Screening PSA today, DRE normal today. He is UTD on colon cancer screening. Tdap today.

## 2013-09-24 NOTE — Progress Notes (Signed)
Pre visit review using our clinic review tool, if applicable. No additional management support is needed unless otherwise documented below in the visit note. 

## 2013-10-03 ENCOUNTER — Other Ambulatory Visit: Payer: Self-pay | Admitting: Family Medicine

## 2013-10-10 ENCOUNTER — Encounter (HOSPITAL_BASED_OUTPATIENT_CLINIC_OR_DEPARTMENT_OTHER): Payer: Self-pay | Admitting: Emergency Medicine

## 2013-10-10 ENCOUNTER — Emergency Department (HOSPITAL_BASED_OUTPATIENT_CLINIC_OR_DEPARTMENT_OTHER): Payer: BC Managed Care – PPO

## 2013-10-10 ENCOUNTER — Emergency Department (HOSPITAL_BASED_OUTPATIENT_CLINIC_OR_DEPARTMENT_OTHER)
Admission: EM | Admit: 2013-10-10 | Discharge: 2013-10-10 | Disposition: A | Discharge status: Home / Self Care | Payer: BC Managed Care – PPO | Attending: Emergency Medicine | Admitting: Emergency Medicine

## 2013-10-10 DIAGNOSIS — N529 Male erectile dysfunction, unspecified: Secondary | ICD-10-CM | POA: Insufficient documentation

## 2013-10-10 DIAGNOSIS — S0990XA Unspecified injury of head, initial encounter: Secondary | ICD-10-CM | POA: Insufficient documentation

## 2013-10-10 DIAGNOSIS — S46909A Unspecified injury of unspecified muscle, fascia and tendon at shoulder and upper arm level, unspecified arm, initial encounter: Secondary | ICD-10-CM | POA: Insufficient documentation

## 2013-10-10 DIAGNOSIS — Z87891 Personal history of nicotine dependence: Secondary | ICD-10-CM | POA: Insufficient documentation

## 2013-10-10 DIAGNOSIS — Z79899 Other long term (current) drug therapy: Secondary | ICD-10-CM | POA: Insufficient documentation

## 2013-10-10 DIAGNOSIS — M51379 Other intervertebral disc degeneration, lumbosacral region without mention of lumbar back pain or lower extremity pain: Secondary | ICD-10-CM | POA: Insufficient documentation

## 2013-10-10 DIAGNOSIS — I10 Essential (primary) hypertension: Secondary | ICD-10-CM | POA: Insufficient documentation

## 2013-10-10 DIAGNOSIS — M1990 Unspecified osteoarthritis, unspecified site: Secondary | ICD-10-CM | POA: Insufficient documentation

## 2013-10-10 DIAGNOSIS — Z7982 Long term (current) use of aspirin: Secondary | ICD-10-CM | POA: Insufficient documentation

## 2013-10-10 DIAGNOSIS — F329 Major depressive disorder, single episode, unspecified: Secondary | ICD-10-CM | POA: Insufficient documentation

## 2013-10-10 DIAGNOSIS — S4980XA Other specified injuries of shoulder and upper arm, unspecified arm, initial encounter: Secondary | ICD-10-CM | POA: Insufficient documentation

## 2013-10-10 DIAGNOSIS — M5137 Other intervertebral disc degeneration, lumbosacral region: Secondary | ICD-10-CM | POA: Insufficient documentation

## 2013-10-10 DIAGNOSIS — Y9389 Activity, other specified: Secondary | ICD-10-CM | POA: Insufficient documentation

## 2013-10-10 DIAGNOSIS — E785 Hyperlipidemia, unspecified: Secondary | ICD-10-CM | POA: Insufficient documentation

## 2013-10-10 DIAGNOSIS — S139XXA Sprain of joints and ligaments of unspecified parts of neck, initial encounter: Secondary | ICD-10-CM | POA: Insufficient documentation

## 2013-10-10 DIAGNOSIS — IMO0002 Reserved for concepts with insufficient information to code with codable children: Secondary | ICD-10-CM | POA: Insufficient documentation

## 2013-10-10 DIAGNOSIS — F3289 Other specified depressive episodes: Secondary | ICD-10-CM | POA: Insufficient documentation

## 2013-10-10 DIAGNOSIS — S161XXA Strain of muscle, fascia and tendon at neck level, initial encounter: Secondary | ICD-10-CM

## 2013-10-10 DIAGNOSIS — E291 Testicular hypofunction: Secondary | ICD-10-CM | POA: Insufficient documentation

## 2013-10-10 DIAGNOSIS — Y92410 Unspecified street and highway as the place of occurrence of the external cause: Secondary | ICD-10-CM | POA: Insufficient documentation

## 2013-10-10 MED ORDER — METHOCARBAMOL 500 MG PO TABS: 500.0000 mg | ORAL_TABLET | Freq: Two times a day (BID) | ORAL | Status: DC

## 2013-10-10 MED ORDER — HYDROCODONE-ACETAMINOPHEN 5-325 MG PO TABS
2.0000 | ORAL_TABLET | Freq: Once | ORAL | Status: AC
  Administered 2013-10-10: 2 via ORAL
  Filled 2013-10-10: qty 2

## 2013-10-10 MED ORDER — HYDROCODONE-ACETAMINOPHEN 5-325 MG PO TABS: 1.0000 | ORAL_TABLET | ORAL | Status: DC | PRN

## 2013-10-10 MED ORDER — NAPROXEN 500 MG PO TABS: 500.0000 mg | ORAL_TABLET | Freq: Two times a day (BID) | ORAL | Status: DC
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
## 2013-10-10 NOTE — Discharge Instructions (Signed)
Cervical Sprain A cervical sprain is an injury in the neck in which the strong, fibrous tissues (ligaments) that connect your neck bones stretch or tear. Cervical sprains can range from mild to severe. Severe cervical sprains can cause the neck vertebrae to be unstable. This can lead to damage of the spinal cord and can result in serious nervous system problems. The amount of time it takes for a cervical sprain to get better depends on the cause and extent of the injury. Most cervical sprains heal in 1 to 3 weeks. CAUSES  Severe cervical sprains may be caused by:   Contact sport injuries (such as from football, rugby, wrestling, hockey, auto racing, gymnastics, diving, martial arts, or boxing).   Motor vehicle collisions.   Whiplash injuries. This is an injury from a sudden forward-and backward whipping movement of the head and neck.  Falls.  Mild cervical sprains may be caused by:   Being in an awkward position, such as while cradling a telephone between your ear and shoulder.   Sitting in a chair that does not offer proper support.   Working at a poorly designed computer station.   Looking up or down for long periods of time.  SYMPTOMS   Pain, soreness, stiffness, or a burning sensation in the front, back, or sides of the neck. This discomfort may develop immediately after the injury or slowly, 24 hours or more after the injury.   Pain or tenderness directly in the middle of the back of the neck.   Shoulder or upper back pain.   Limited ability to move the neck.   Headache.   Dizziness.   Weakness, numbness, or tingling in the hands or arms.   Muscle spasms.   Difficulty swallowing or chewing.   Tenderness and swelling of the neck.  DIAGNOSIS  Most of the time your health care provider can diagnose a cervical sprain by taking your history and doing a physical exam. Your health care provider will ask about previous neck injuries and any known neck  problems, such as arthritis in the neck. X-rays may be taken to find out if there are any other problems, such as with the bones of the neck. Other tests, such as a CT scan or MRI, may also be needed.  TREATMENT  Treatment depends on the severity of the cervical sprain. Mild sprains can be treated with rest, keeping the neck in place (immobilization), and pain medicines. Severe cervical sprains are immediately immobilized. Further treatment is done to help with pain, muscle spasms, and other symptoms and may include:  Medicines, such as pain relievers, numbing medicines, or muscle relaxants.   Physical therapy. This may involve stretching exercises, strengthening exercises, and posture training. Exercises and improved posture can help stabilize the neck, strengthen muscles, and help stop symptoms from returning.  HOME CARE INSTRUCTIONS   Put ice on the injured area.   Put ice in a plastic bag.   Place a towel between your skin and the bag.   Leave the ice on for 15 20 minutes, 3 4 times a day.   If your injury was severe, you may have been given a cervical collar to wear. A cervical collar is a two-piece collar designed to keep your neck from moving while it heals.  Do not remove the collar unless instructed by your health care provider.  If you have long hair, keep it outside of the collar.  Ask your health care provider before making any adjustments to your collar.   Minor adjustments may be required over time to improve comfort and reduce pressure on your chin or on the back of your head.  Ifyou are allowed to remove the collar for cleaning or bathing, follow your health care provider's instructions on how to do so safely.  Keep your collar clean by wiping it with mild soap and water and drying it completely. If the collar you have been given includes removable pads, remove them every 1 2 days and hand wash them with soap and water. Allow them to air dry. They should be completely  dry before you wear them in the collar.  If you are allowed to remove the collar for cleaning and bathing, wash and dry the skin of your neck. Check your skin for irritation or sores. If you see any, tell your health care provider.  Do not drive while wearing the collar.   Only take over-the-counter or prescription medicines for pain, discomfort, or fever as directed by your health care provider.   Keep all follow-up appointments as directed by your health care provider.   Keep all physical therapy appointments as directed by your health care provider.   Make any needed adjustments to your workstation to promote good posture.   Avoid positions and activities that make your symptoms worse.   Warm up and stretch before being active to help prevent problems.  SEEK MEDICAL CARE IF:   Your pain is not controlled with medicine.   You are unable to decrease your pain medicine over time as planned.   Your activity level is not improving as expected.  SEEK IMMEDIATE MEDICAL CARE IF:   You develop any bleeding.  You develop stomach upset.  You have signs of an allergic reaction to your medicine.   Your symptoms get worse.   You develop new, unexplained symptoms.   You have numbness, tingling, weakness, or paralysis in any part of your body.  MAKE SURE YOU:   Understand these instructions.  Will watch your condition.  Will get help right away if you are not doing well or get worse. Document Released: 02/18/2007 Document Revised: 02/11/2013 Document Reviewed: 10/29/2012 Exit[employer] Patient Information 2014 [employer].  
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
## 2013-10-10 NOTE — ED Notes (Signed)
C Spine immobilization applied d/t cervical tenderness with palpation.

## 2013-10-10 NOTE — ED Notes (Signed)
MD at bedside. 

## 2013-12-24 ENCOUNTER — Ambulatory Visit: Payer: BC Managed Care – PPO | Admitting: Family Medicine

## 2013-12-28 ENCOUNTER — Other Ambulatory Visit: Payer: Self-pay | Admitting: Family Medicine

## 2014-02-11 ENCOUNTER — Other Ambulatory Visit: Payer: Self-pay | Admitting: Family Medicine

## 2014-02-25 ENCOUNTER — Telehealth: Payer: Self-pay

## 2014-02-25 NOTE — Telephone Encounter (Signed)
[facility] requesting refill on Testosterone 200mg /ml. The Rx is expired. Last ov 09/24/2013.

## 2014-02-27 MED ORDER — TESTOSTERONE CYPIONATE 200 MG/ML IM SOLN: INTRAMUSCULAR | Status: DC

## 2014-02-27 NOTE — Telephone Encounter (Signed)
Testost rx printed. 

## 2014-03-01 NOTE — Telephone Encounter (Signed)
Done

## 2014-03-16 ENCOUNTER — Other Ambulatory Visit: Payer: Self-pay | Admitting: Family Medicine

## 2014-05-31 ENCOUNTER — Encounter: Payer: Self-pay | Admitting: Family Medicine

## 2014-05-31 ENCOUNTER — Ambulatory Visit (INDEPENDENT_AMBULATORY_CARE_PROVIDER_SITE_OTHER): Payer: Self-pay | Admitting: Family Medicine

## 2014-05-31 VITALS — BP 128/89 | HR 72 | Temp 97.4°F | Ht 70.0 in | Wt 202.7 lb

## 2014-05-31 DIAGNOSIS — R0602 Shortness of breath: Secondary | ICD-10-CM

## 2014-05-31 DIAGNOSIS — J029 Acute pharyngitis, unspecified: Secondary | ICD-10-CM

## 2014-05-31 LAB — POCT RAPID STREP A (OFFICE): Rapid Strep A Screen: NEGATIVE

## 2014-05-31 NOTE — Progress Notes (Signed)
Pre visit review using our clinic review tool, if applicable. No additional management support is needed unless otherwise documented below in the visit note. 

## 2014-05-31 NOTE — Progress Notes (Signed)
OFFICE NOTE  05/31/2014  CC:  Chief Complaint  Patient presents with  . Sore Throat   HPI: Patient is a 59 y.o. Caucasian male who is here for ST. Onset 24h ago, getting worse.  Very sore throat, "shallow breathing", malaise. No cough and says he doesn't feel "congested" in chest.  No pain in chest.  No palpitations.  +HA along with ST.  Has had 1 wk of clear runny nose prior to onset of the ST.  Ibuprofen 800mg  yesterday x 2 doses.  None today.   Pertinent PMH:  Past medical, surgical, social, and family history reviewed and no changes are noted since last office visit.  MEDS:  Outpatient Prescriptions Prior to Visit  Medication Sig Dispense Refill  . aspirin 81 MG tablet Take 81 mg by mouth daily.      Marland Kitchen atorvastatin (LIPITOR) 40 MG tablet TAKE ONE TABLET BY MOUTH DAILY 30 tablet 3  . buPROPion (WELLBUTRIN SR) 150 MG 12 hr tablet Take 150 mg by mouth 2 (two) times daily.     . butalbital-acetaminophen-caffeine (FIORICET, ESGIC) 50-325-40 MG per tablet Take by mouth as needed.    . carisoprodol (SOMA) 350 MG tablet TAKE ONE TABLET TWICE DAILY AS NEEDED FOR MUSCLE SPASMS 60 tablet 3  . HYDROcodone-acetaminophen (NORCO/VICODIN) 5-325 MG per tablet Take 1 tablet by mouth every 4 (four) hours as needed. 10 tablet 0  . hydrocortisone (ANUSOL-HC) 2.5 % rectal cream Place 1 application rectally 2 (two) times daily. 30 g 1  . ibuprofen (ADVIL,MOTRIN) 800 MG tablet     . lisinopril (PRINIVIL,ZESTRIL) 40 MG tablet TAKE ONE TABLET BY MOUTH DAILY 90 tablet 0  . methocarbamol (ROBAXIN) 500 MG tablet Take 1 tablet (500 mg total) by mouth 2 (two) times daily. 20 tablet 0  . morphine (MS CONTIN) 30 MG 12 hr tablet Take 30 mg by mouth 3 (three) times daily.    . Multiple Vitamins-Minerals (CENTRUM SILVER PO) Take 1 tablet by mouth daily.      . naproxen (NAPROSYN) 500 MG tablet Take 1 tablet (500 mg total) by mouth 2 (two) times daily. 30 tablet 0  . oxyCODONE (ROXICODONE) 15 MG immediate release  tablet Take 1 tablet by mouth every 6 (six) hours as needed.    . polyethylene glycol powder (GLYCOLAX/MIRALAX) powder     . sertraline (ZOLOFT) 100 MG tablet Take 100 mg by mouth daily.    Marland Kitchen testosterone cypionate (DEPOTESTOTERONE CYPIONATE) 200 MG/ML injection INJECT 1 ML INTO THE MUSCLE EVERY 14 DAYS 10 mL 1  . zolpidem (AMBIEN) 10 MG tablet Take 1 tablet by mouth At bedtime as needed.     No facility-administered medications prior to visit.    PE: Blood pressure 128/89, pulse 72, temperature 97.4 F (36.3 C), temperature source Temporal, height 5\' 10"  (1.778 m), weight 202 lb 11.2 oz (91.944 kg), SpO2 93 %.  Repeat oxygen sat 97% (see assessment and plan for more specific info on oxygen sat). VS: noted--normal. Gen: alert, NAD, tired but NONTOXIC APPEARING.  Oriented x 4.  AFFECT: pleasant, lucid thought and speech. HEENT: eyes without injection, drainage, or swelling.  Ears: EACs clear, TMs with normal light reflex and landmarks.  Nose: Clear rhinorrhea, with some dried, crusty exudate adherent to mildly injected mucosa.  No purulent d/c.  No paranasal sinus TTP.  No facial swelling.  Throat and mouth without focal lesion.  No pharyngial swelling, erythema, or exudate.   Neck: supple, no LAD.   LUNGS: CTA bilat, nonlabored  resps.   CV: RRR, no m/r/g. EXT: no c/c/e SKIN: no rash  LAB: rapid strep neg  IMPRESSION AND PLAN:  Acute pharyngitis, rapid strep neg. Subjective feeling of breathlessness but no abnormal objective pulm/cv exam findings. His oxygen sat on RA today had a couple of quick drops (over 5-8 seconds) from 95 down to 90, and once down to 85, but quickly climbed back up to 97 and stayed at 95-97 for the remainder of the visit.  He was never SOB or cyanotic.  I don't think these oxygen sat findings are clinically meaningful.  Will order CXR but patient said he could not go get this today.  He said he'll go get it tomorrow morning. In the meantime, he'll continue  symptomatic care for his sore throat. Group A strep throat clx sent.  An After Visit Summary was printed and given to the patient.  FOLLOW UP: prn

## 2014-06-01 ENCOUNTER — Telehealth: Payer: Self-pay | Admitting: Family Medicine

## 2014-06-01 NOTE — Telephone Encounter (Signed)
Pt was seen yesterday and was wondering what Strep test showed. His symptoms have worsened and he has developed a terrible cough. Would like something called in for this./ [facility]

## 2014-06-01 NOTE — Telephone Encounter (Signed)
Patient states that he now has a severe cough and he would like cough medication.  I told patient that Dr. Anitra Lauth was not here on Tuesday afternoons and that he should try OTC mucinex or robitussin.  Pt wasn't please with these suggestions.  Please advise.

## 2014-06-01 NOTE — Telephone Encounter (Signed)
Pt needs to get the CXR I ordered. I agree with the recommendation of mucinex DM or robitussin DM OTC. Throat culture negative so far but not final.-thx

## 2014-06-02 ENCOUNTER — Other Ambulatory Visit: Payer: Self-pay | Admitting: *Deleted

## 2014-06-02 ENCOUNTER — Ambulatory Visit (HOSPITAL_BASED_OUTPATIENT_CLINIC_OR_DEPARTMENT_OTHER)
Admission: RE | Admit: 2014-06-02 | Discharge: 2014-06-02 | Disposition: A | Discharge status: Home / Self Care | Payer: BLUE CROSS/BLUE SHIELD | Source: Ambulatory Visit | Attending: Family Medicine | Admitting: Family Medicine

## 2014-06-02 DIAGNOSIS — R0602 Shortness of breath: Secondary | ICD-10-CM

## 2014-06-02 DIAGNOSIS — R05 Cough: Secondary | ICD-10-CM | POA: Diagnosis not present

## 2014-06-02 DIAGNOSIS — R509 Fever, unspecified: Secondary | ICD-10-CM | POA: Diagnosis not present

## 2014-06-02 MED ORDER — AMOXICILLIN 875 MG PO TABS: 875.0000 mg | ORAL_TABLET | Freq: Two times a day (BID) | ORAL | Status: DC

## 2014-06-02 NOTE — Telephone Encounter (Signed)
Patient was running a fever of 101 last night, he was too sick to get CXR yesterday but he is going now to get it done now. Please call him back at [phone].

## 2014-06-02 NOTE — Telephone Encounter (Signed)
Patient taken care of via lab results.

## 2014-06-04 LAB — CULTURE, GROUP A STREP: ORGANISM ID, BACTERIA: NORMAL

## 2014-07-05 ENCOUNTER — Other Ambulatory Visit: Payer: Self-pay | Admitting: Family Medicine

## 2014-07-08 ENCOUNTER — Other Ambulatory Visit: Payer: Self-pay | Admitting: Family Medicine

## 2014-11-01 ENCOUNTER — Encounter: Payer: Self-pay | Admitting: Family Medicine

## 2014-11-01 ENCOUNTER — Ambulatory Visit (INDEPENDENT_AMBULATORY_CARE_PROVIDER_SITE_OTHER): Payer: BLUE CROSS/BLUE SHIELD | Admitting: Family Medicine

## 2014-11-01 VITALS — BP 115/83 | HR 67 | Temp 98.2°F | Resp 16 | Ht 70.0 in | Wt 194.0 lb

## 2014-11-01 DIAGNOSIS — L720 Epidermal cyst: Secondary | ICD-10-CM

## 2014-11-01 DIAGNOSIS — J4520 Mild intermittent asthma, uncomplicated: Secondary | ICD-10-CM | POA: Diagnosis not present

## 2014-11-01 DIAGNOSIS — N644 Mastodynia: Secondary | ICD-10-CM

## 2014-11-01 MED ORDER — ALBUTEROL SULFATE HFA 108 (90 BASE) MCG/ACT IN AERS: 2.0000 | INHALATION_SPRAY | Freq: Four times a day (QID) | RESPIRATORY_TRACT | Status: DC | PRN

## 2014-11-01 NOTE — Progress Notes (Signed)
OFFICE NOTE  11/01/2014  CC:  Chief Complaint  Patient presents with  . Cyst    left nipple x 3-4 days, painful  . Cough    x 3-4 weeks, dry     HPI: Patient is a 59 y.o. Caucasian male who is here for about 3-4d hx of a sore lumb around L nipple, has a white spot in middle.  No fevers or malaise.  Nothing similar on body in the past. Says nothing has flowed out of the center of nipple except last night when he stuck a needle in the center of it and he got return of small amount of sero-sanquinous fluid.  No galactorrhea.  Also with intermittent cough "irritating" with "hard to breath" feeling lately but only for a few an hour or two. Mucinex helps all his sx's.  No fevers.  Denies GER sx's. The feeling/cough occur about q1-2 days.  Started more during warmer weather lately. No fevers, no LE swelling, no PND or orthopnea.  No chest pain.  Pertinent PMH:  Past medical, surgical, social, and family history reviewed and no changes are noted since last office visit.  MEDS: Pt no longer taking narcotic pain meds or testost listed below Outpatient Prescriptions Prior to Visit  Medication Sig Dispense Refill  . aspirin 81 MG tablet Take 81 mg by mouth daily.      Marland Kitchen atorvastatin (LIPITOR) 40 MG tablet TAKE ONE TABLET BY MOUTH DAILY 30 tablet 3  . buPROPion (WELLBUTRIN SR) 150 MG 12 hr tablet Take 150 mg by mouth 2 (two) times daily.     . carisoprodol (SOMA) 350 MG tablet TAKE ONE TABLET TWICE DAILY AS NEEDED FOR MUSCLE SPASMS 60 tablet 3  . ibuprofen (ADVIL,MOTRIN) 800 MG tablet     . lisinopril (PRINIVIL,ZESTRIL) 40 MG tablet TAKE ONE TABLET BY MOUTH DAILY 90 tablet 1  . Multiple Vitamins-Minerals (CENTRUM SILVER PO) Take 1 tablet by mouth daily.      . polyethylene glycol powder (GLYCOLAX/MIRALAX) powder     . sertraline (ZOLOFT) 100 MG tablet Take 100 mg by mouth daily.    Marland Kitchen zolpidem (AMBIEN) 10 MG tablet Take 1 tablet by mouth At bedtime as needed.    .  butalbital-acetaminophen-caffeine (FIORICET, ESGIC) 50-325-40 MG per tablet Take by mouth as needed.    Marland Kitchen HYDROcodone-acetaminophen (NORCO/VICODIN) 5-325 MG per tablet Take 1 tablet by mouth every 4 (four) hours as needed. (Patient not taking: Reported on 05/31/2014) 10 tablet 0  . hydrocortisone (ANUSOL-HC) 2.5 % rectal cream Place 1 application rectally 2 (two) times daily. (Patient not taking: Reported on 11/01/2014) 30 g 1  . methocarbamol (ROBAXIN) 500 MG tablet Take 1 tablet (500 mg total) by mouth 2 (two) times daily. (Patient not taking: Reported on 05/31/2014) 20 tablet 0  . morphine (MS CONTIN) 30 MG 12 hr tablet Take 30 mg by mouth 3 (three) times daily.    . naproxen (NAPROSYN) 500 MG tablet Take 1 tablet (500 mg total) by mouth 2 (two) times daily. (Patient not taking: Reported on 11/01/2014) 30 tablet 0  . oxyCODONE (ROXICODONE) 15 MG immediate release tablet Take 1 tablet by mouth every 6 (six) hours as needed.    . testosterone cypionate (DEPOTESTOTERONE CYPIONATE) 200 MG/ML injection INJECT 1 ML INTO THE MUSCLE EVERY 14 DAYS (Patient not taking: Reported on 11/01/2014) 10 mL 1  . amoxicillin (AMOXIL) 875 MG tablet Take 1 tablet (875 mg total) by mouth 2 (two) times daily. (Patient not taking: Reported on 11/01/2014)  20 tablet 0   No facility-administered medications prior to visit.    PE: Blood pressure 115/83, pulse 67, temperature 98.2 F (36.8 C), temperature source Oral, resp. rate 16, height 5\' 10"  (1.778 m), weight 194 lb (87.998 kg), SpO2 94 %. Gen: Alert, well appearing.  Patient is oriented to person, place, time, and situation. AFFECT: pleasant, lucid thought and speech. NLZ:JQBH: no injection, icteris, swelling, or exudate.  EOMI, PERRLA. Mouth: lips without lesion/swelling.  Oral mucosa pink and moist. Oropharynx without erythema, exudate, or swelling.  CV: RRR, no m/r/g.   LUNGS: CTA bilat, nonlabored resps, good aeration in all lung fields.  Mild prolongation of  expiratory phase and mild post-exhalation coughing noted.   Left nipple with punctate white dot plugging central aspect of the tip of pt's nipple.  No mass/nodule palpable.  Mild discomfort of tip of nipple when palpated.  No erythema or induration.  No visible or palpable gynecomastia. Axillae w/out nodule or tenderness.   IMPRESSION AND PLAN:  1) Left nipple epidermal inclusion cyst. Discussed warm compresses 20 min bid - tid. Signs/symptoms to call or return for were reviewed and pt expressed understanding.  2) Mild asthma symptoms, worse lately with heat/poor air quality. Albuterol HFA rx'd, CMA did inhaler education today. He'll return in 1 mo and we'll check the extent of his albut use/requirement over the last month and see if controller med indicated.  An After Visit Summary was printed and given to the patient.  FOLLOW UP: 4 wks

## 2014-11-01 NOTE — Progress Notes (Signed)
Pre visit review using our clinic review tool, if applicable. No additional management support is needed unless otherwise documented below in the visit note. 

## 2014-11-15 ENCOUNTER — Telehealth: Payer: Self-pay | Admitting: Family Medicine

## 2014-11-15 DIAGNOSIS — R05 Cough: Secondary | ICD-10-CM

## 2014-11-15 DIAGNOSIS — R059 Cough, unspecified: Secondary | ICD-10-CM

## 2014-11-15 NOTE — Telephone Encounter (Signed)
Please advise. Thanks.  

## 2014-11-15 NOTE — Telephone Encounter (Signed)
Patient is not getting any better, cough is getting more persistant. The inhaler isn't helping. Patient would like to know if he can go to the [facility] to get a chest xray?

## 2014-11-15 NOTE — Telephone Encounter (Signed)
Pt advised and voiced understanding.   

## 2014-11-15 NOTE — Telephone Encounter (Signed)
OK, CXR ordered. Pt likely needs some prednisone, but we'll see what the chest x-ray shows first.-thx

## 2014-11-16 ENCOUNTER — Other Ambulatory Visit: Payer: Self-pay | Admitting: *Deleted

## 2014-11-16 ENCOUNTER — Ambulatory Visit (HOSPITAL_BASED_OUTPATIENT_CLINIC_OR_DEPARTMENT_OTHER)
Admission: RE | Admit: 2014-11-16 | Discharge: 2014-11-16 | Disposition: A | Discharge status: Home / Self Care | Payer: BLUE CROSS/BLUE SHIELD | Source: Ambulatory Visit | Attending: Family Medicine | Admitting: Family Medicine

## 2014-11-16 DIAGNOSIS — R062 Wheezing: Secondary | ICD-10-CM | POA: Diagnosis not present

## 2014-11-16 DIAGNOSIS — R0989 Other specified symptoms and signs involving the circulatory and respiratory systems: Secondary | ICD-10-CM | POA: Diagnosis not present

## 2014-11-16 DIAGNOSIS — R05 Cough: Secondary | ICD-10-CM | POA: Insufficient documentation

## 2014-11-16 DIAGNOSIS — R059 Cough, unspecified: Secondary | ICD-10-CM

## 2014-11-16 MED ORDER — PREDNISONE 20 MG PO TABS: ORAL_TABLET | ORAL | Status: DC

## 2014-11-29 ENCOUNTER — Encounter: Payer: Self-pay | Admitting: Family Medicine

## 2014-11-29 ENCOUNTER — Ambulatory Visit (INDEPENDENT_AMBULATORY_CARE_PROVIDER_SITE_OTHER): Payer: BLUE CROSS/BLUE SHIELD | Admitting: Family Medicine

## 2014-11-29 VITALS — BP 127/88 | HR 76 | Temp 98.1°F | Resp 16 | Ht 70.0 in | Wt 195.0 lb

## 2014-11-29 DIAGNOSIS — R05 Cough: Secondary | ICD-10-CM | POA: Diagnosis not present

## 2014-11-29 DIAGNOSIS — R058 Other specified cough: Secondary | ICD-10-CM

## 2014-11-29 MED ORDER — LOSARTAN POTASSIUM 100 MG PO TABS: 100.0000 mg | ORAL_TABLET | Freq: Every day | ORAL | Status: DC

## 2014-11-29 MED ORDER — ATORVASTATIN CALCIUM 40 MG PO TABS: 40.0000 mg | ORAL_TABLET | Freq: Every day | ORAL | Status: DC

## 2014-11-29 NOTE — Progress Notes (Signed)
Pre visit review using our clinic review tool, if applicable. No additional management support is needed unless otherwise documented below in the visit note. 
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
## 2015-01-11 ENCOUNTER — Other Ambulatory Visit: Payer: Self-pay | Admitting: Family Medicine

## 2015-01-12 ENCOUNTER — Telehealth: Payer: Self-pay | Admitting: *Deleted

## 2015-01-12 MED ORDER — LISINOPRIL 40 MG PO TABS: 40.0000 mg | ORAL_TABLET | Freq: Every day | ORAL | Status: DC

## 2015-01-12 MED ORDER — AMLODIPINE BESYLATE 10 MG PO TABS: 10.0000 mg | ORAL_TABLET | Freq: Every day | ORAL | Status: DC

## 2015-01-12 NOTE — Telephone Encounter (Signed)
Left message for pt to call back  °

## 2015-01-12 NOTE — Telephone Encounter (Signed)
Spoke to pt and he stated that after he spoke to me he went online and read about lisinopril causing coughing and breathing problems and that the effects could last for a few months after stopping. He stated that he would like to try something other than lisinopril and losartan. Please advise. Thanks.

## 2015-01-12 NOTE — Telephone Encounter (Signed)
Left message for pt to call back. Rx for lisinopril has been cancelled and new Rx for amlodipine sent.

## 2015-01-12 NOTE — Telephone Encounter (Signed)
Fax from pharmacy requesting refill for Lisinopril. Note on fax states that Aaron Adams has been taking Losartan Potassium 100mg  but would like to be switched back to Lisinopril 10mg . Per pts lov lisinopril was stopped due to cough and Aaron Adams was started on losartan. Spoke to Aaron Adams and he stated that the Losartan made him feel flushed. He stated that he still has the cough so he doesn't feel his cough was from the Lisinopril. Please advise. Thanks.

## 2015-01-12 NOTE — Telephone Encounter (Signed)
It was actually 40mg  lisinopril. Rx sent to pharmacy.  D/C losartan. -thx

## 2015-01-12 NOTE — Telephone Encounter (Signed)
Fine. Pls call his pharmacy and cancel the lisinopril I already sent in. Pls rx amlodipine 10mg , 1 tab po qd, #30, RF x 6.-thx

## 2015-01-19 NOTE — Telephone Encounter (Signed)
Pt advised and voiced understanding.   

## 2015-02-01 ENCOUNTER — Encounter: Payer: Self-pay | Admitting: Family Medicine

## 2015-02-01 ENCOUNTER — Ambulatory Visit (INDEPENDENT_AMBULATORY_CARE_PROVIDER_SITE_OTHER): Payer: BLUE CROSS/BLUE SHIELD | Admitting: Family Medicine

## 2015-02-01 VITALS — BP 126/85 | HR 71 | Temp 98.1°F | Resp 16 | Ht 70.0 in | Wt 197.0 lb

## 2015-02-01 DIAGNOSIS — R05 Cough: Secondary | ICD-10-CM | POA: Diagnosis not present

## 2015-02-01 DIAGNOSIS — Z125 Encounter for screening for malignant neoplasm of prostate: Secondary | ICD-10-CM

## 2015-02-01 DIAGNOSIS — Z0000 Encounter for general adult medical examination without abnormal findings: Secondary | ICD-10-CM | POA: Diagnosis not present

## 2015-02-01 DIAGNOSIS — T464X5A Adverse effect of angiotensin-converting-enzyme inhibitors, initial encounter: Principal | ICD-10-CM

## 2015-02-01 DIAGNOSIS — R058 Other specified cough: Secondary | ICD-10-CM

## 2015-02-01 LAB — COMPREHENSIVE METABOLIC PANEL
ALBUMIN: 4.7 g/dL (ref 3.5–5.2)
ALK PHOS: 72 U/L (ref 39–117)
ALT: 38 U/L (ref 0–53)
AST: 26 U/L (ref 0–37)
BUN: 12 mg/dL (ref 6–23)
CO2: 30 mEq/L (ref 19–32)
Calcium: 9.9 mg/dL (ref 8.4–10.5)
Chloride: 102 mEq/L (ref 96–112)
Creatinine, Ser: 0.9 mg/dL (ref 0.40–1.50)
GFR: 92.15 mL/min (ref >60.00)
Glucose, Bld: 97 mg/dL (ref 70–99)
POTASSIUM: 4.4 meq/L (ref 3.5–5.1)
SODIUM: 141 meq/L (ref 135–145)
TOTAL PROTEIN: 7.1 g/dL (ref 6.0–8.3)
Total Bilirubin: 0.6 mg/dL (ref 0.2–1.2)

## 2015-02-01 LAB — PSA: PSA: 0.98 ng/mL (ref 0.10–4.00)

## 2015-02-01 LAB — LIPID PANEL
CHOLESTEROL: 186 mg/dL (ref 0–200)
HDL: 62.4 mg/dL (ref >39.00)
LDL Cholesterol: 94 mg/dL (ref 0–99)
NonHDL: 123.31
Total CHOL/HDL Ratio: 3
Triglycerides: 145 mg/dL (ref 0.0–149.0)
VLDL: 29 mg/dL (ref 0.0–40.0)

## 2015-02-01 LAB — CBC WITH DIFFERENTIAL/PLATELET
Basophils Absolute: 0 10*3/uL (ref 0.0–0.1)
Basophils Relative: 0.6 % (ref 0.0–3.0)
EOS PCT: 1 % (ref 0.0–5.0)
Eosinophils Absolute: 0.1 10*3/uL (ref 0.0–0.7)
HEMATOCRIT: 45.5 % (ref 39.0–52.0)
HEMOGLOBIN: 15.4 g/dL (ref 13.0–17.0)
LYMPHS PCT: 32.1 % (ref 12.0–46.0)
Lymphs Abs: 2 10*3/uL (ref 0.7–4.0)
MCHC: 33.8 g/dL (ref 30.0–36.0)
MCV: 92.4 fl (ref 78.0–100.0)
Monocytes Absolute: 0.5 10*3/uL (ref 0.1–1.0)
Monocytes Relative: 8.7 % (ref 3.0–12.0)
Neutro Abs: 3.5 10*3/uL (ref 1.4–7.7)
Neutrophils Relative %: 57.6 % (ref 43.0–77.0)
Platelets: 221 10*3/uL (ref 150.0–400.0)
RBC: 4.92 Mil/uL (ref 4.22–5.81)
RDW: 14 % (ref 11.5–15.5)
WBC: 6.1 10*3/uL (ref 4.0–10.5)

## 2015-02-01 LAB — TSH: TSH: 1.06 u[IU]/mL (ref 0.35–4.50)

## 2015-02-01 NOTE — Progress Notes (Signed)
Pre visit review using our clinic review tool, if applicable. No additional management support is needed unless otherwise documented below in the visit note. 
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
## 2015-02-09 ENCOUNTER — Ambulatory Visit (HOSPITAL_BASED_OUTPATIENT_CLINIC_OR_DEPARTMENT_OTHER)
Admission: RE | Admit: 2015-02-09 | Discharge: 2015-02-09 | Disposition: A | Discharge status: Home / Self Care | Payer: BLUE CROSS/BLUE SHIELD | Source: Ambulatory Visit | Attending: Family Medicine | Admitting: Family Medicine

## 2015-02-09 ENCOUNTER — Ambulatory Visit (INDEPENDENT_AMBULATORY_CARE_PROVIDER_SITE_OTHER): Payer: BLUE CROSS/BLUE SHIELD | Admitting: Family Medicine

## 2015-02-09 ENCOUNTER — Other Ambulatory Visit: Payer: Self-pay | Admitting: Family Medicine

## 2015-02-09 ENCOUNTER — Encounter: Payer: Self-pay | Admitting: Family Medicine

## 2015-02-09 VITALS — BP 133/91 | HR 79 | Temp 98.1°F | Resp 16 | Ht 69.0 in | Wt 197.0 lb

## 2015-02-09 DIAGNOSIS — M25475 Effusion, left foot: Secondary | ICD-10-CM | POA: Diagnosis not present

## 2015-02-09 DIAGNOSIS — M79672 Pain in left foot: Secondary | ICD-10-CM

## 2015-02-09 DIAGNOSIS — Z0000 Encounter for general adult medical examination without abnormal findings: Secondary | ICD-10-CM

## 2015-02-09 DIAGNOSIS — R936 Abnormal findings on diagnostic imaging of limbs: Secondary | ICD-10-CM | POA: Insufficient documentation

## 2015-02-09 DIAGNOSIS — M7989 Other specified soft tissue disorders: Secondary | ICD-10-CM | POA: Diagnosis not present

## 2015-02-09 NOTE — Progress Notes (Signed)
Pre visit review using our clinic review tool, if applicable. No additional management support is needed unless otherwise documented below in the visit note. 
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
## 2015-02-10 ENCOUNTER — Other Ambulatory Visit: Payer: Self-pay | Admitting: Family Medicine

## 2015-02-10 DIAGNOSIS — R936 Abnormal findings on diagnostic imaging of limbs: Secondary | ICD-10-CM

## 2015-02-10 DIAGNOSIS — R229 Localized swelling, mass and lump, unspecified: Secondary | ICD-10-CM

## 2015-02-10 DIAGNOSIS — M79672 Pain in left foot: Secondary | ICD-10-CM

## 2015-02-10 DIAGNOSIS — M25475 Effusion, left foot: Secondary | ICD-10-CM

## 2015-02-10 DIAGNOSIS — R937 Abnormal findings on diagnostic imaging of other parts of musculoskeletal system: Secondary | ICD-10-CM

## 2015-02-11 ENCOUNTER — Telehealth: Payer: Self-pay | Admitting: Family Medicine

## 2015-02-11 NOTE — Telephone Encounter (Signed)
I recommend the MRI b/c I don't know what he has going on in his foot plus the radiologist who read his ultrasound recommended the MRI.  This is what I told him on the phone the other day.

## 2015-02-11 NOTE — Telephone Encounter (Signed)
Patient scheduled 02/12/15

## 2015-02-11 NOTE — Telephone Encounter (Signed)
Please advise. Thanks.  

## 2015-02-11 NOTE — Telephone Encounter (Signed)
Patient would like to know why Dr. Anitra Lauth recommends having the MRI before he schedules. The swelling in his foot has gone down but the knot is still there although it has moved a little bit. Please call him.

## 2015-02-11 NOTE — Telephone Encounter (Signed)
Pt aware.   Diane, can you please schedule.

## 2015-02-12 ENCOUNTER — Ambulatory Visit (HOSPITAL_BASED_OUTPATIENT_CLINIC_OR_DEPARTMENT_OTHER)
Admission: RE | Admit: 2015-02-12 | Discharge: 2015-02-12 | Disposition: A | Discharge status: Home / Self Care | Payer: BLUE CROSS/BLUE SHIELD | Source: Ambulatory Visit | Attending: Family Medicine | Admitting: Family Medicine

## 2015-02-12 DIAGNOSIS — M7989 Other specified soft tissue disorders: Secondary | ICD-10-CM | POA: Insufficient documentation

## 2015-02-12 DIAGNOSIS — M79672 Pain in left foot: Secondary | ICD-10-CM | POA: Diagnosis present

## 2015-02-12 DIAGNOSIS — R937 Abnormal findings on diagnostic imaging of other parts of musculoskeletal system: Secondary | ICD-10-CM

## 2015-02-12 DIAGNOSIS — M25475 Effusion, left foot: Secondary | ICD-10-CM

## 2015-02-12 DIAGNOSIS — R229 Localized swelling, mass and lump, unspecified: Secondary | ICD-10-CM

## 2015-02-12 DIAGNOSIS — R2242 Localized swelling, mass and lump, left lower limb: Secondary | ICD-10-CM | POA: Diagnosis not present

## 2015-02-12 DIAGNOSIS — R936 Abnormal findings on diagnostic imaging of limbs: Secondary | ICD-10-CM

## 2015-02-12 MED ORDER — GADOBENATE DIMEGLUMINE 529 MG/ML IV SOLN: 18.0000 mL | Freq: Once | INTRAVENOUS | Status: DC | PRN

## 2015-03-16 ENCOUNTER — Other Ambulatory Visit: Payer: Self-pay | Admitting: Family Medicine

## 2015-03-16 NOTE — Telephone Encounter (Signed)
RF request for atorvastatin LOV: 02/19/15 Next ov: None Last written: 11/29/14 #30 w/ 3RF

## 2015-04-21 ENCOUNTER — Other Ambulatory Visit: Payer: Self-pay | Admitting: Family Medicine

## 2015-04-21 NOTE — Telephone Encounter (Signed)
RF request for amlodipine - requesting 90 day supply LOV: 02/09/15 Next ov: None Last written: 01/12/15 #30 w/ 6RF

## 2015-06-23 ENCOUNTER — Other Ambulatory Visit: Payer: Self-pay | Admitting: *Deleted

## 2015-06-23 MED ORDER — ATORVASTATIN CALCIUM 40 MG PO TABS: 40.0000 mg | ORAL_TABLET | Freq: Every day | ORAL | Status: DC

## 2015-06-23 NOTE — Telephone Encounter (Signed)
RF request for atorvastatin LOV: 02/09/15 Next ov: None Last written: 03/16/15 #30 w/ 11RF  Fax from pharmacy requesting 90 day supply.

## 2015-11-05 DIAGNOSIS — N503 Cyst of epididymis: Secondary | ICD-10-CM

## 2015-11-05 HISTORY — DX: Cyst of epididymis: N50.3

## 2015-11-28 ENCOUNTER — Ambulatory Visit (INDEPENDENT_AMBULATORY_CARE_PROVIDER_SITE_OTHER): Payer: BLUE CROSS/BLUE SHIELD | Admitting: Family Medicine

## 2015-11-28 ENCOUNTER — Encounter: Payer: Self-pay | Admitting: Family Medicine

## 2015-11-28 VITALS — BP 135/90 | HR 82 | Temp 98.1°F | Resp 16 | Ht 69.0 in | Wt 209.0 lb

## 2015-11-28 DIAGNOSIS — N509 Disorder of male genital organs, unspecified: Secondary | ICD-10-CM

## 2015-11-28 DIAGNOSIS — N5089 Other specified disorders of the male genital organs: Secondary | ICD-10-CM

## 2015-11-28 NOTE — Progress Notes (Signed)
Pre visit review using our clinic review tool, if applicable. No additional management support is needed unless otherwise documented below in the visit note. 
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
## 2015-11-29 ENCOUNTER — Other Ambulatory Visit: Payer: Self-pay | Admitting: Family Medicine

## 2015-11-29 DIAGNOSIS — N492 Inflammatory disorders of scrotum: Secondary | ICD-10-CM

## 2015-11-30 ENCOUNTER — Ambulatory Visit (HOSPITAL_BASED_OUTPATIENT_CLINIC_OR_DEPARTMENT_OTHER)
Admission: RE | Admit: 2015-11-30 | Discharge: 2015-11-30 | Disposition: A | Discharge status: Home / Self Care | Payer: BLUE CROSS/BLUE SHIELD | Source: Ambulatory Visit | Attending: Family Medicine | Admitting: Family Medicine

## 2015-11-30 ENCOUNTER — Encounter: Payer: Self-pay | Admitting: Family Medicine

## 2015-11-30 DIAGNOSIS — N509 Disorder of male genital organs, unspecified: Secondary | ICD-10-CM | POA: Diagnosis present

## 2015-11-30 DIAGNOSIS — N5089 Other specified disorders of the male genital organs: Secondary | ICD-10-CM

## 2015-11-30 DIAGNOSIS — N499 Inflammatory disorder of unspecified male genital organ: Secondary | ICD-10-CM | POA: Insufficient documentation

## 2015-11-30 DIAGNOSIS — N503 Cyst of epididymis: Secondary | ICD-10-CM | POA: Insufficient documentation

## 2015-11-30 DIAGNOSIS — N433 Hydrocele, unspecified: Secondary | ICD-10-CM | POA: Insufficient documentation

## 2015-11-30 DIAGNOSIS — N492 Inflammatory disorders of scrotum: Secondary | ICD-10-CM

## 2015-12-27 ENCOUNTER — Ambulatory Visit: Payer: BLUE CROSS/BLUE SHIELD | Admitting: Family Medicine

## 2015-12-27 ENCOUNTER — Telehealth: Payer: Self-pay | Admitting: *Deleted

## 2015-12-27 NOTE — Telephone Encounter (Signed)
Pt called office on 12/21/15 wanting to know when his last lipid and liver test were done. I advised pt that he last had these checked on 02/01/15. He then stated that he felt that they needed to be checked again since he was taking a statin medication. I reviewed pts chart and noted that he had not been seen for a follow up or CPE since 02/01/15. I advised pt that it would be best for him to schedule a office visit with Dr. Anitra Lauth to follow up on his RCI and at that time Dr. Anitra Lauth can order any labs that may need to be done. Pt voiced understanding and apt was made for today (12/27/15) at 9:30am. Pt came into the office today stating that he was only schedule for a lab visit and was not going to stay to see Dr. Anitra Lauth. Pt left the office. Per Estill Bamberg pt will receive a no show fee.

## 2016-02-07 DIAGNOSIS — H527 Unspecified disorder of refraction: Secondary | ICD-10-CM | POA: Diagnosis not present

## 2016-03-05 DIAGNOSIS — H53149 Visual discomfort, unspecified: Secondary | ICD-10-CM | POA: Diagnosis not present

## 2016-03-05 DIAGNOSIS — H524 Presbyopia: Secondary | ICD-10-CM | POA: Diagnosis not present

## 2016-04-18 DIAGNOSIS — L259 Unspecified contact dermatitis, unspecified cause: Secondary | ICD-10-CM | POA: Diagnosis not present

## 2016-04-18 DIAGNOSIS — F329 Major depressive disorder, single episode, unspecified: Secondary | ICD-10-CM | POA: Diagnosis not present

## 2016-04-18 DIAGNOSIS — F419 Anxiety disorder, unspecified: Secondary | ICD-10-CM | POA: Diagnosis not present

## 2016-05-02 ENCOUNTER — Other Ambulatory Visit: Payer: Self-pay | Admitting: Family Medicine

## 2016-05-02 NOTE — Telephone Encounter (Signed)
RF request for amlodipine LOV: 02/01/15 Next ov: None Last written: 04/21/15 #90 w/ 3RF  RF request for atorvastatin Last written: 06/23/15 #90 w/ 3RF  Rx's sent fo #30 w/ 0RF. Pt is over due for f/u RCI or CPE, needs office visit for more refills.   Please see phone note from 12/27/15. Please call pt to advise. Thanks.

## 2016-05-28 DIAGNOSIS — M25512 Pain in left shoulder: Secondary | ICD-10-CM | POA: Diagnosis not present

## 2016-05-31 DIAGNOSIS — M25512 Pain in left shoulder: Secondary | ICD-10-CM | POA: Diagnosis not present

## 2016-06-05 ENCOUNTER — Ambulatory Visit (INDEPENDENT_AMBULATORY_CARE_PROVIDER_SITE_OTHER): Payer: BLUE CROSS/BLUE SHIELD | Admitting: Family Medicine

## 2016-06-05 ENCOUNTER — Other Ambulatory Visit: Payer: Self-pay | Admitting: Family Medicine

## 2016-06-05 ENCOUNTER — Encounter: Payer: Self-pay | Admitting: Family Medicine

## 2016-06-05 VITALS — BP 129/85 | HR 91 | Temp 97.4°F | Resp 16 | Ht 69.0 in | Wt 212.5 lb

## 2016-06-05 DIAGNOSIS — J029 Acute pharyngitis, unspecified: Secondary | ICD-10-CM

## 2016-06-05 DIAGNOSIS — B9789 Other viral agents as the cause of diseases classified elsewhere: Secondary | ICD-10-CM | POA: Diagnosis not present

## 2016-06-05 DIAGNOSIS — M25512 Pain in left shoulder: Secondary | ICD-10-CM | POA: Diagnosis not present

## 2016-06-05 DIAGNOSIS — J069 Acute upper respiratory infection, unspecified: Secondary | ICD-10-CM | POA: Diagnosis not present

## 2016-06-05 LAB — POCT RAPID STREP A (OFFICE): RAPID STREP A SCREEN: NEGATIVE

## 2016-06-05 NOTE — Progress Notes (Signed)
Pre visit review using our clinic review tool, if applicable. No additional management support is needed unless otherwise documented below in the visit note. 

## 2016-06-05 NOTE — Patient Instructions (Signed)
Get otc generic robitussin DM OR Mucinex DM and use as directed on the packaging for cough and congestion. Use otc generic saline nasal spray 2-3 times per day to irrigate/moisturize your nasal passages.   
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
## 2016-06-06 ENCOUNTER — Telehealth: Payer: Self-pay | Admitting: Family Medicine

## 2016-06-06 ENCOUNTER — Other Ambulatory Visit: Payer: Self-pay | Admitting: Family Medicine

## 2016-06-06 MED ORDER — OXYCODONE HCL 5 MG PO TABS: ORAL_TABLET | ORAL | 0 refills | Status: DC

## 2016-06-06 NOTE — Telephone Encounter (Signed)
Spoke to Dr Anitra Lauth, he stated that the med prescribed is much stronger than the cough syrup Rx.  Patient's wife aware and agrees.

## 2016-06-06 NOTE — Telephone Encounter (Signed)
Noted agree

## 2016-06-06 NOTE — Telephone Encounter (Signed)
Please advise 

## 2016-06-06 NOTE — Telephone Encounter (Signed)
Patient's wife is wondering if he would benefit better with the RX cough syrup?  She wasn't sure and she is fine with either, she just wondered if that would be a better benefit, please advise?

## 2016-06-06 NOTE — Telephone Encounter (Signed)
No fever.  Rx at front desk for wife to pick up.

## 2016-06-06 NOTE — Telephone Encounter (Signed)
Patient Name: WANYA BELMONT  DOB: [birth year]    Initial Comment Caller states husband was seen yesterday, now throat is so bad, he cannot swallow saliva. He is haivng throat pain, no trouble breathing.   Nurse Assessment  Nurse: Raphael Gibney, RN, Vanita Ingles Date/Time (Eastern Time): 06/06/2016 10:34:00 AM  Confirm and document reason for call. If symptomatic, describe symptoms. ---Caller states spouse was seen at the office yesterday. Strep test was negative. He can swallow but it is very painful. Feels like razor blades. No fever. OTC meds is not helping. Pt can not talk due to throat pain.  Does the patient have any new or worsening symptoms? ---Yes  Will a triage be completed? ---Yes  Related visit to physician within the last 2 weeks? ---Yes  Does the PT have any chronic conditions? (i.e. diabetes, asthma, etc.) ---Yes  List chronic conditions. ---HTN  Is this a behavioral health or substance abuse call? ---No     Guidelines    Guideline Title Affirmed Question Affirmed Notes  Sore Throat SEVERE (e.g., excruciating) throat pain    Final Disposition User   See Physician within 24 Hours Stringer, RN, Vanita Ingles    Comments  Spouse states pt was in the office yesterday and does not want to bring him in for another appt. Wants to know if medication can be called in. Please call spouse back regarding medication.   Referrals  GO TO FACILITY REFUSED   Disagree/Comply: Disagree  Disagree/Comply Reason: Unable to access HCP

## 2016-06-06 NOTE — Telephone Encounter (Signed)
Any fever? I can print out a rx for a pain med to help with the throat pain.

## 2016-06-07 LAB — CULTURE, GROUP A STREP: Organism ID, Bacteria: NORMAL

## 2016-06-11 ENCOUNTER — Telehealth: Payer: Self-pay | Admitting: Family Medicine

## 2016-06-11 NOTE — Telephone Encounter (Signed)
Pt states that he is not better from last Monday's visit and asking if something could be called in for him. [place]

## 2016-06-12 MED ORDER — AMOXICILLIN 875 MG PO TABS: 875.0000 mg | ORAL_TABLET | Freq: Two times a day (BID) | ORAL | 0 refills | Status: AC

## 2016-06-12 NOTE — Telephone Encounter (Signed)
Pt advised and voiced understanding.   

## 2016-06-12 NOTE — Telephone Encounter (Signed)
OK. Amoxicillin eRx'd 

## 2016-06-12 NOTE — Telephone Encounter (Signed)
Please advise. Thanks.  

## 2016-06-27 DIAGNOSIS — M24112 Other articular cartilage disorders, left shoulder: Secondary | ICD-10-CM | POA: Diagnosis not present

## 2016-06-27 DIAGNOSIS — S46012D Strain of muscle(s) and tendon(s) of the rotator cuff of left shoulder, subsequent encounter: Secondary | ICD-10-CM | POA: Diagnosis not present

## 2016-06-27 DIAGNOSIS — M75122 Complete rotator cuff tear or rupture of left shoulder, not specified as traumatic: Secondary | ICD-10-CM | POA: Diagnosis not present

## 2016-06-27 DIAGNOSIS — G8918 Other acute postprocedural pain: Secondary | ICD-10-CM | POA: Diagnosis not present

## 2016-06-27 DIAGNOSIS — M7542 Impingement syndrome of left shoulder: Secondary | ICD-10-CM | POA: Diagnosis not present

## 2016-06-27 DIAGNOSIS — M7552 Bursitis of left shoulder: Secondary | ICD-10-CM | POA: Diagnosis not present

## 2016-06-27 DIAGNOSIS — M19012 Primary osteoarthritis, left shoulder: Secondary | ICD-10-CM | POA: Diagnosis not present

## 2016-07-03 DIAGNOSIS — M19012 Primary osteoarthritis, left shoulder: Secondary | ICD-10-CM | POA: Diagnosis not present

## 2016-07-24 DIAGNOSIS — M19012 Primary osteoarthritis, left shoulder: Secondary | ICD-10-CM | POA: Diagnosis not present

## 2016-07-27 ENCOUNTER — Encounter: Payer: Self-pay | Admitting: Family Medicine

## 2016-08-13 DIAGNOSIS — M47812 Spondylosis without myelopathy or radiculopathy, cervical region: Secondary | ICD-10-CM | POA: Diagnosis not present

## 2016-08-19 ENCOUNTER — Other Ambulatory Visit: Payer: Self-pay | Admitting: Family Medicine

## 2016-08-20 NOTE — Telephone Encounter (Signed)
[facility].  RF request for amlodipine LOV: 02/09/15 Next ov: None Last written: 06/06/16 330 w/ 2RF  Pt is over due for CPE or f/u RCI. Will send Rx for #30 w/ 0RF. Pt needs office visit for more refills.

## 2016-08-20 NOTE — Telephone Encounter (Signed)
Pt advised and voiced understanding.  Apt made for 08/31/16 at 10:30am.

## 2016-08-21 DIAGNOSIS — M19012 Primary osteoarthritis, left shoulder: Secondary | ICD-10-CM | POA: Diagnosis not present

## 2016-08-23 ENCOUNTER — Encounter: Payer: Self-pay | Admitting: Family Medicine

## 2016-08-31 ENCOUNTER — Encounter: Payer: Self-pay | Admitting: Family Medicine

## 2016-08-31 ENCOUNTER — Ambulatory Visit (INDEPENDENT_AMBULATORY_CARE_PROVIDER_SITE_OTHER): Payer: BLUE CROSS/BLUE SHIELD | Admitting: Family Medicine

## 2016-08-31 VITALS — BP 124/85 | HR 75 | Temp 98.6°F | Resp 16 | Ht 69.0 in | Wt 204.0 lb

## 2016-08-31 DIAGNOSIS — F339 Major depressive disorder, recurrent, unspecified: Secondary | ICD-10-CM

## 2016-08-31 DIAGNOSIS — F909 Attention-deficit hyperactivity disorder, unspecified type: Secondary | ICD-10-CM

## 2016-08-31 DIAGNOSIS — E7800 Pure hypercholesterolemia, unspecified: Secondary | ICD-10-CM

## 2016-08-31 DIAGNOSIS — I10 Essential (primary) hypertension: Secondary | ICD-10-CM | POA: Diagnosis not present

## 2016-08-31 MED ORDER — METHYLPHENIDATE HCL ER 27 MG PO TB24: ORAL_TABLET | ORAL | 0 refills | Status: DC

## 2016-08-31 MED ORDER — ATORVASTATIN CALCIUM 40 MG PO TABS: 40.0000 mg | ORAL_TABLET | Freq: Every day | ORAL | 3 refills | Status: DC

## 2016-08-31 MED ORDER — AMLODIPINE BESYLATE 10 MG PO TABS: ORAL_TABLET | ORAL | 3 refills | Status: DC

## 2016-08-31 NOTE — Progress Notes (Signed)
Pre visit review using our clinic review tool, if applicable. No additional management support is needed unless otherwise documented below in the visit note. 
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
## 2016-09-05 DIAGNOSIS — R42 Dizziness and giddiness: Secondary | ICD-10-CM | POA: Diagnosis not present

## 2016-09-05 DIAGNOSIS — Z23 Encounter for immunization: Secondary | ICD-10-CM | POA: Diagnosis not present

## 2016-09-05 DIAGNOSIS — I10 Essential (primary) hypertension: Secondary | ICD-10-CM | POA: Diagnosis not present

## 2016-09-05 DIAGNOSIS — R600 Localized edema: Secondary | ICD-10-CM | POA: Diagnosis not present

## 2016-09-05 DIAGNOSIS — Z0389 Encounter for observation for other suspected diseases and conditions ruled out: Secondary | ICD-10-CM | POA: Diagnosis not present

## 2016-09-10 DIAGNOSIS — M47812 Spondylosis without myelopathy or radiculopathy, cervical region: Secondary | ICD-10-CM | POA: Diagnosis not present

## 2016-09-10 DIAGNOSIS — I10 Essential (primary) hypertension: Secondary | ICD-10-CM | POA: Diagnosis not present

## 2016-09-10 DIAGNOSIS — M542 Cervicalgia: Secondary | ICD-10-CM | POA: Diagnosis not present

## 2016-09-10 DIAGNOSIS — Z6829 Body mass index (BMI) 29.0-29.9, adult: Secondary | ICD-10-CM | POA: Diagnosis not present

## 2016-09-16 ENCOUNTER — Encounter: Payer: Self-pay | Admitting: Family Medicine

## 2016-09-18 DIAGNOSIS — F411 Generalized anxiety disorder: Secondary | ICD-10-CM | POA: Diagnosis not present

## 2016-09-18 DIAGNOSIS — F331 Major depressive disorder, recurrent, moderate: Secondary | ICD-10-CM | POA: Diagnosis not present

## 2016-09-27 ENCOUNTER — Other Ambulatory Visit: Payer: Self-pay | Admitting: Family Medicine

## 2016-09-27 MED ORDER — METHYLPHENIDATE HCL ER 27 MG PO TB24: ORAL_TABLET | ORAL | 0 refills | Status: DC

## 2016-09-27 NOTE — Telephone Encounter (Signed)
Concerta Rx. Patient going out of town today, will run out Sunday. Please call him back

## 2016-09-27 NOTE — Telephone Encounter (Signed)
Will authorize 1 mo of concerta but he needs office f/u for adult ADD in 1 mo.-thx

## 2016-09-27 NOTE — Telephone Encounter (Signed)
RF request for concerta LOV: 11/04/08 Next ov: 10/10/16 Last written: 08/31/16 #30 w/ 0RF  Please advise. Thanks.   See message below.

## 2016-09-27 NOTE — Telephone Encounter (Signed)
Pt advised and voiced understanding. He has a f/u apt made for 10/10/16 at 8:15am. Rx put up front for p/u.

## 2016-10-04 DIAGNOSIS — M19012 Primary osteoarthritis, left shoulder: Secondary | ICD-10-CM | POA: Diagnosis not present

## 2016-10-10 ENCOUNTER — Ambulatory Visit (INDEPENDENT_AMBULATORY_CARE_PROVIDER_SITE_OTHER): Payer: BLUE CROSS/BLUE SHIELD | Admitting: Family Medicine

## 2016-10-10 ENCOUNTER — Encounter: Payer: Self-pay | Admitting: Family Medicine

## 2016-10-10 VITALS — BP 111/73 | HR 76 | Temp 98.1°F | Resp 16 | Ht 69.0 in | Wt 201.5 lb

## 2016-10-10 DIAGNOSIS — F909 Attention-deficit hyperactivity disorder, unspecified type: Secondary | ICD-10-CM

## 2016-10-10 MED ORDER — METHYLPHENIDATE HCL ER (OSM) 54 MG PO TBCR: 54.0000 mg | EXTENDED_RELEASE_TABLET | ORAL | 0 refills | Status: DC
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
## 2016-11-05 DIAGNOSIS — M19012 Primary osteoarthritis, left shoulder: Secondary | ICD-10-CM | POA: Diagnosis not present

## 2016-11-12 ENCOUNTER — Ambulatory Visit (INDEPENDENT_AMBULATORY_CARE_PROVIDER_SITE_OTHER): Payer: BLUE CROSS/BLUE SHIELD | Admitting: Family Medicine

## 2016-11-12 ENCOUNTER — Ambulatory Visit: Payer: BLUE CROSS/BLUE SHIELD | Admitting: Family Medicine

## 2016-11-12 ENCOUNTER — Encounter: Payer: Self-pay | Admitting: Family Medicine

## 2016-11-12 VITALS — BP 127/81 | HR 89 | Temp 98.2°F | Resp 16 | Ht 69.0 in | Wt 198.8 lb

## 2016-11-12 DIAGNOSIS — F909 Attention-deficit hyperactivity disorder, unspecified type: Secondary | ICD-10-CM

## 2016-11-12 MED ORDER — METHYLPHENIDATE HCL ER 36 MG PO TB24: ORAL_TABLET | ORAL | 0 refills | Status: DC
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
## 2016-11-14 ENCOUNTER — Ambulatory Visit: Payer: BLUE CROSS/BLUE SHIELD | Admitting: Family Medicine

## 2016-12-19 ENCOUNTER — Ambulatory Visit (INDEPENDENT_AMBULATORY_CARE_PROVIDER_SITE_OTHER): Payer: BLUE CROSS/BLUE SHIELD | Admitting: Family Medicine

## 2016-12-19 ENCOUNTER — Encounter: Payer: Self-pay | Admitting: Family Medicine

## 2016-12-19 VITALS — BP 125/83 | HR 90 | Temp 98.1°F | Resp 16 | Ht 69.0 in | Wt 197.5 lb

## 2016-12-19 DIAGNOSIS — F909 Attention-deficit hyperactivity disorder, unspecified type: Secondary | ICD-10-CM | POA: Diagnosis not present

## 2016-12-19 MED ORDER — METHYLPHENIDATE HCL ER 36 MG PO TB24: ORAL_TABLET | ORAL | 0 refills | Status: DC
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
## 2017-02-07 DIAGNOSIS — H5203 Hypermetropia, bilateral: Secondary | ICD-10-CM | POA: Diagnosis not present

## 2017-03-11 DIAGNOSIS — E785 Hyperlipidemia, unspecified: Secondary | ICD-10-CM | POA: Diagnosis not present

## 2017-03-11 DIAGNOSIS — Z114 Encounter for screening for human immunodeficiency virus [HIV]: Secondary | ICD-10-CM | POA: Diagnosis not present

## 2017-03-11 DIAGNOSIS — M25511 Pain in right shoulder: Secondary | ICD-10-CM | POA: Diagnosis not present

## 2017-03-11 DIAGNOSIS — M2550 Pain in unspecified joint: Secondary | ICD-10-CM | POA: Diagnosis not present

## 2017-03-11 DIAGNOSIS — Z23 Encounter for immunization: Secondary | ICD-10-CM | POA: Diagnosis not present

## 2017-03-11 DIAGNOSIS — Z0000 Encounter for general adult medical examination without abnormal findings: Secondary | ICD-10-CM | POA: Diagnosis not present

## 2017-03-11 DIAGNOSIS — I10 Essential (primary) hypertension: Secondary | ICD-10-CM | POA: Diagnosis not present

## 2017-03-11 DIAGNOSIS — M069 Rheumatoid arthritis, unspecified: Secondary | ICD-10-CM | POA: Diagnosis not present

## 2017-03-11 LAB — LIPID PANEL
CHOLESTEROL: 186 (ref 0–200)
HDL: 56 (ref 35–70)
LDL CALC: 105
TRIGLYCERIDES: 124 (ref 40–160)

## 2017-03-11 LAB — HM HIV SCREENING LAB: HM HIV Screening: NEGATIVE

## 2017-03-11 LAB — BASIC METABOLIC PANEL
Creatinine: 0.9 (ref <=1.3)
GLUCOSE: 97
POTASSIUM: 4.3 (ref 3.4–5.3)
SODIUM: 139 (ref 137–147)

## 2017-03-11 LAB — HEPATIC FUNCTION PANEL
ALK PHOS: 96 (ref 25–125)
ALT: 62 — AB (ref 10–40)
AST: 36 (ref 14–40)
BILIRUBIN DIRECT: 0.2
BILIRUBIN, TOTAL: 0.6

## 2017-03-11 LAB — HEMOGLOBIN A1C: Hemoglobin A1C: 5.8

## 2017-03-22 ENCOUNTER — Encounter: Payer: Self-pay | Admitting: Family Medicine

## 2017-03-22 ENCOUNTER — Other Ambulatory Visit: Payer: Self-pay

## 2017-03-22 ENCOUNTER — Ambulatory Visit (INDEPENDENT_AMBULATORY_CARE_PROVIDER_SITE_OTHER): Payer: BLUE CROSS/BLUE SHIELD | Admitting: Family Medicine

## 2017-03-22 ENCOUNTER — Encounter: Payer: Self-pay | Admitting: *Deleted

## 2017-03-22 VITALS — BP 132/79 | HR 80 | Temp 98.1°F | Resp 16 | Ht 69.0 in | Wt 203.5 lb

## 2017-03-22 DIAGNOSIS — F909 Attention-deficit hyperactivity disorder, unspecified type: Secondary | ICD-10-CM

## 2017-03-22 DIAGNOSIS — Z0000 Encounter for general adult medical examination without abnormal findings: Secondary | ICD-10-CM | POA: Diagnosis not present

## 2017-03-22 DIAGNOSIS — M791 Myalgia, unspecified site: Secondary | ICD-10-CM

## 2017-03-22 MED ORDER — METHYLPHENIDATE HCL ER 36 MG PO TB24: ORAL_TABLET | ORAL | 0 refills | Status: DC

## 2017-03-22 MED ORDER — CARISOPRODOL 350 MG PO TABS: 350.0000 mg | ORAL_TABLET | Freq: Two times a day (BID) | ORAL | 5 refills | Status: DC | PRN
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
## 2017-05-14 ENCOUNTER — Ambulatory Visit: Payer: BLUE CROSS/BLUE SHIELD | Admitting: Family Medicine

## 2017-05-14 ENCOUNTER — Encounter: Payer: Self-pay | Admitting: Family Medicine

## 2017-05-14 VITALS — BP 126/78 | HR 91 | Temp 98.2°F | Resp 16 | Ht 69.0 in | Wt 204.0 lb

## 2017-05-14 DIAGNOSIS — J209 Acute bronchitis, unspecified: Secondary | ICD-10-CM | POA: Diagnosis not present

## 2017-05-14 DIAGNOSIS — J0100 Acute maxillary sinusitis, unspecified: Secondary | ICD-10-CM | POA: Diagnosis not present

## 2017-05-14 DIAGNOSIS — R062 Wheezing: Secondary | ICD-10-CM | POA: Diagnosis not present

## 2017-05-14 MED ORDER — HYDROCODONE-HOMATROPINE 5-1.5 MG/5ML PO SYRP: ORAL_SOLUTION | ORAL | 0 refills | Status: DC

## 2017-05-14 MED ORDER — DOXYCYCLINE HYCLATE 100 MG PO CAPS: ORAL_CAPSULE | ORAL | 0 refills | Status: DC

## 2017-05-14 MED ORDER — PREDNISONE 20 MG PO TABS: ORAL_TABLET | ORAL | 0 refills | Status: DC
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
## 2017-06-14 ENCOUNTER — Other Ambulatory Visit: Payer: Self-pay | Admitting: Family Medicine

## 2017-07-12 ENCOUNTER — Other Ambulatory Visit: Payer: Self-pay | Admitting: Family Medicine

## 2017-07-12 NOTE — Telephone Encounter (Signed)
[facility]  RF request for carisoprodol LOV: 03/22/17 Next ov: None Last written: 03/22/17 #30 w/ 5RF  Please advise. Thanks.

## 2017-07-12 NOTE — Telephone Encounter (Signed)
Rx faxed

## 2017-07-22 DIAGNOSIS — F331 Major depressive disorder, recurrent, moderate: Secondary | ICD-10-CM | POA: Diagnosis not present

## 2017-11-14 ENCOUNTER — Ambulatory Visit: Payer: BLUE CROSS/BLUE SHIELD | Admitting: Family Medicine

## 2017-11-14 ENCOUNTER — Encounter: Payer: Self-pay | Admitting: Family Medicine

## 2017-11-14 VITALS — BP 132/80 | HR 76 | Temp 98.3°F | Resp 16 | Ht 69.0 in | Wt 209.0 lb

## 2017-11-14 DIAGNOSIS — M5442 Lumbago with sciatica, left side: Secondary | ICD-10-CM | POA: Diagnosis not present

## 2017-11-14 MED ORDER — PREDNISONE 20 MG PO TABS: ORAL_TABLET | ORAL | 0 refills | Status: DC

## 2017-11-14 MED ORDER — HYDROCODONE-ACETAMINOPHEN 5-325 MG PO TABS: 1.0000 | ORAL_TABLET | Freq: Four times a day (QID) | ORAL | 0 refills | Status: DC | PRN

## 2017-11-14 NOTE — Patient Instructions (Signed)
Stop ibuprofen/Motrin. No aleve. No tylenol for now.
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
## 2017-11-15 ENCOUNTER — Telehealth: Payer: Self-pay

## 2017-11-15 NOTE — Telephone Encounter (Signed)
Carisoprodol was denied.  They will only approve 12 weeks for short term relief.

## 2017-11-15 NOTE — Telephone Encounter (Signed)
Pls notify pt that he will have to pay for any additional RFs of this med "out of pocket" b/c of his insurer's coverage limitations.  Does he need a new rx?

## 2017-11-15 NOTE — Telephone Encounter (Signed)
Phone call attempted. Mailbox is full and unable to leave message.

## 2017-11-18 NOTE — Telephone Encounter (Signed)
Patient notified and verbalized understanding. 

## 2017-12-04 ENCOUNTER — Telehealth: Payer: Self-pay | Admitting: *Deleted

## 2017-12-04 NOTE — Telephone Encounter (Signed)
Copied from [place] [id number]. Topic: General - Deceased Patient >> 01-Jan-2018 10:16 AM Oneta Rack wrote: Relation to pt: self  Call back number: [phone]   Reason for call:  Patient requesting imaging of left side hip pain following up from 11/14/17 office visit, pain has not improved, please advise. Patient declined PT at this time stating he doesn't have a way there

## 2017-12-04 NOTE — Telephone Encounter (Signed)
Please advise. Thanks.  

## 2017-12-05 DIAGNOSIS — M545 Low back pain: Secondary | ICD-10-CM | POA: Diagnosis not present

## 2017-12-05 DIAGNOSIS — M25552 Pain in left hip: Secondary | ICD-10-CM | POA: Diagnosis not present

## 2017-12-06 DIAGNOSIS — M545 Low back pain: Secondary | ICD-10-CM | POA: Diagnosis not present

## 2017-12-09 DIAGNOSIS — M545 Low back pain: Secondary | ICD-10-CM | POA: Diagnosis not present

## 2017-12-09 NOTE — Telephone Encounter (Signed)
I need him to f/u in office to determine which x-rays are most appropriate.-thx

## 2017-12-09 NOTE — Telephone Encounter (Signed)
Left message for pt to call back  °

## 2017-12-09 NOTE — Telephone Encounter (Signed)
SW pt, he stated that his orthopedic doctor ordered the xrays and a MRI.

## 2017-12-30 ENCOUNTER — Other Ambulatory Visit: Payer: Self-pay | Admitting: Family Medicine

## 2017-12-30 DIAGNOSIS — M545 Low back pain: Secondary | ICD-10-CM | POA: Diagnosis not present

## 2017-12-30 DIAGNOSIS — I10 Essential (primary) hypertension: Secondary | ICD-10-CM | POA: Diagnosis not present

## 2017-12-30 DIAGNOSIS — M5416 Radiculopathy, lumbar region: Secondary | ICD-10-CM | POA: Diagnosis not present

## 2017-12-30 DIAGNOSIS — M5126 Other intervertebral disc displacement, lumbar region: Secondary | ICD-10-CM | POA: Diagnosis not present

## 2018-01-09 ENCOUNTER — Other Ambulatory Visit: Payer: Self-pay | Admitting: Family Medicine

## 2018-01-09 DIAGNOSIS — M48061 Spinal stenosis, lumbar region without neurogenic claudication: Secondary | ICD-10-CM | POA: Diagnosis not present

## 2018-01-09 DIAGNOSIS — M5116 Intervertebral disc disorders with radiculopathy, lumbar region: Secondary | ICD-10-CM | POA: Diagnosis not present

## 2018-01-09 DIAGNOSIS — M5126 Other intervertebral disc displacement, lumbar region: Secondary | ICD-10-CM | POA: Diagnosis not present

## 2018-01-09 HISTORY — PX: LUMBAR MICRODISCECTOMY: SHX99

## 2018-01-10 NOTE — Telephone Encounter (Signed)
RF request for carisoprodol LOV: 11/14/17 Next ov: None Last written: 07/12/17 #30 w/ 5RF  Please advise. Thanks.

## 2018-01-13 ENCOUNTER — Encounter: Payer: Self-pay | Admitting: Family Medicine

## 2018-01-30 DIAGNOSIS — H524 Presbyopia: Secondary | ICD-10-CM | POA: Diagnosis not present

## 2018-02-07 ENCOUNTER — Encounter: Payer: Self-pay | Admitting: Family Medicine

## 2018-03-18 DIAGNOSIS — H524 Presbyopia: Secondary | ICD-10-CM | POA: Diagnosis not present

## 2018-03-24 DIAGNOSIS — M5126 Other intervertebral disc displacement, lumbar region: Secondary | ICD-10-CM | POA: Diagnosis not present

## 2018-03-27 ENCOUNTER — Encounter: Payer: Self-pay | Admitting: Family Medicine

## 2018-03-31 DIAGNOSIS — M5416 Radiculopathy, lumbar region: Secondary | ICD-10-CM | POA: Diagnosis not present

## 2018-03-31 LAB — BASIC METABOLIC PANEL
BUN: 13 (ref 4–21)
Creatinine: 0.8 (ref 0.6–1.3)

## 2018-04-02 DIAGNOSIS — F338 Other recurrent depressive disorders: Secondary | ICD-10-CM | POA: Diagnosis not present

## 2018-04-02 DIAGNOSIS — M5126 Other intervertebral disc displacement, lumbar region: Secondary | ICD-10-CM | POA: Diagnosis not present

## 2018-04-02 DIAGNOSIS — M5127 Other intervertebral disc displacement, lumbosacral region: Secondary | ICD-10-CM | POA: Diagnosis not present

## 2018-04-02 DIAGNOSIS — F419 Anxiety disorder, unspecified: Secondary | ICD-10-CM | POA: Diagnosis not present

## 2018-05-15 ENCOUNTER — Encounter: Payer: Self-pay | Admitting: Family Medicine

## 2018-07-08 ENCOUNTER — Other Ambulatory Visit: Payer: Self-pay | Admitting: Family Medicine

## 2018-07-14 NOTE — Telephone Encounter (Signed)
Left message for pt to call back or check mychart.  Okay for P[facility] to advise pt and schedule f/u.

## 2018-07-17 NOTE — Telephone Encounter (Signed)
Left message for pt to call back.   Okay for P[facility] to advise pt and schedule f/u.

## 2018-10-13 ENCOUNTER — Other Ambulatory Visit: Payer: Self-pay | Admitting: Family Medicine

## 2018-10-13 MED ORDER — ATORVASTATIN CALCIUM 40 MG PO TABS: 40.0000 mg | ORAL_TABLET | Freq: Every day | ORAL | 0 refills | Status: DC

## 2018-10-13 MED ORDER — AMLODIPINE BESYLATE 10 MG PO TABS: 10.0000 mg | ORAL_TABLET | Freq: Every day | ORAL | 0 refills | Status: DC

## 2018-10-13 NOTE — Telephone Encounter (Signed)
Patient requested RF of amlodipine and atorvastatin.  RX were sent 07/09/2018 #90 and patient was advised at that time to schedule appointment.    I sent in # 14 days supply for both refills and transferred to front desk for them to schedule.  Patient knows only # 14 were sent into pharmacy.

## 2018-10-16 DIAGNOSIS — F338 Other recurrent depressive disorders: Secondary | ICD-10-CM | POA: Diagnosis not present

## 2018-10-16 DIAGNOSIS — F419 Anxiety disorder, unspecified: Secondary | ICD-10-CM | POA: Diagnosis not present

## 2018-10-30 ENCOUNTER — Other Ambulatory Visit: Payer: Self-pay

## 2018-10-30 ENCOUNTER — Ambulatory Visit (INDEPENDENT_AMBULATORY_CARE_PROVIDER_SITE_OTHER): Payer: BC Managed Care – PPO | Admitting: Family Medicine

## 2018-10-30 ENCOUNTER — Encounter: Payer: Self-pay | Admitting: Family Medicine

## 2018-10-30 VITALS — Wt 206.0 lb

## 2018-10-30 DIAGNOSIS — R609 Edema, unspecified: Secondary | ICD-10-CM

## 2018-10-30 DIAGNOSIS — I10 Essential (primary) hypertension: Secondary | ICD-10-CM

## 2018-10-30 DIAGNOSIS — T887XXA Unspecified adverse effect of drug or medicament, initial encounter: Secondary | ICD-10-CM

## 2018-10-30 DIAGNOSIS — H9313 Tinnitus, bilateral: Secondary | ICD-10-CM | POA: Diagnosis not present

## 2018-10-30 DIAGNOSIS — E785 Hyperlipidemia, unspecified: Secondary | ICD-10-CM

## 2018-10-30 MED ORDER — METOPROLOL SUCCINATE ER 50 MG PO TB24: 50.0000 mg | ORAL_TABLET | Freq: Every day | ORAL | 1 refills | Status: DC

## 2018-10-30 MED ORDER — ATORVASTATIN CALCIUM 40 MG PO TABS: 40.0000 mg | ORAL_TABLET | Freq: Every day | ORAL | 0 refills | Status: DC
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
## 2018-11-05 DIAGNOSIS — R0609 Other forms of dyspnea: Secondary | ICD-10-CM | POA: Diagnosis not present

## 2018-11-05 DIAGNOSIS — R070 Pain in throat: Secondary | ICD-10-CM | POA: Diagnosis not present

## 2018-11-05 DIAGNOSIS — R05 Cough: Secondary | ICD-10-CM | POA: Diagnosis not present

## 2018-11-29 ENCOUNTER — Encounter: Payer: Self-pay | Admitting: Family Medicine

## 2018-11-29 DIAGNOSIS — T819XXA Unspecified complication of procedure, initial encounter: Secondary | ICD-10-CM | POA: Insufficient documentation

## 2018-11-29 DIAGNOSIS — M5030 Other cervical disc degeneration, unspecified cervical region: Secondary | ICD-10-CM | POA: Insufficient documentation

## 2018-11-29 DIAGNOSIS — M545 Low back pain, unspecified: Secondary | ICD-10-CM | POA: Insufficient documentation

## 2018-11-29 DIAGNOSIS — M797 Fibromyalgia: Secondary | ICD-10-CM | POA: Insufficient documentation

## 2018-12-04 ENCOUNTER — Ambulatory Visit (INDEPENDENT_AMBULATORY_CARE_PROVIDER_SITE_OTHER): Payer: BC Managed Care – PPO | Admitting: Family Medicine

## 2018-12-04 ENCOUNTER — Encounter: Payer: Self-pay | Admitting: Family Medicine

## 2018-12-04 ENCOUNTER — Other Ambulatory Visit: Payer: Self-pay

## 2018-12-04 VITALS — BP 160/95 | Temp 96.9°F | Ht 70.0 in | Wt 210.0 lb

## 2018-12-04 DIAGNOSIS — E7800 Pure hypercholesterolemia, unspecified: Secondary | ICD-10-CM | POA: Diagnosis not present

## 2018-12-04 DIAGNOSIS — E669 Obesity, unspecified: Secondary | ICD-10-CM

## 2018-12-04 DIAGNOSIS — I10 Essential (primary) hypertension: Secondary | ICD-10-CM | POA: Diagnosis not present

## 2018-12-04 DIAGNOSIS — H9313 Tinnitus, bilateral: Secondary | ICD-10-CM | POA: Diagnosis not present

## 2018-12-04 DIAGNOSIS — N529 Male erectile dysfunction, unspecified: Secondary | ICD-10-CM

## 2018-12-04 MED ORDER — METOPROLOL SUCCINATE ER 50 MG PO TB24: 50.0000 mg | ORAL_TABLET | Freq: Every day | ORAL | 3 refills | Status: DC
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
## 2018-12-05 ENCOUNTER — Other Ambulatory Visit: Payer: Self-pay | Admitting: Family Medicine

## 2018-12-05 ENCOUNTER — Other Ambulatory Visit: Payer: Self-pay

## 2018-12-05 MED ORDER — ATORVASTATIN CALCIUM 40 MG PO TABS: 40.0000 mg | ORAL_TABLET | Freq: Every day | ORAL | 3 refills | Status: AC

## 2019-01-16 DIAGNOSIS — G479 Sleep disorder, unspecified: Secondary | ICD-10-CM | POA: Diagnosis not present

## 2019-01-16 DIAGNOSIS — F419 Anxiety disorder, unspecified: Secondary | ICD-10-CM | POA: Diagnosis not present

## 2019-01-16 DIAGNOSIS — F338 Other recurrent depressive disorders: Secondary | ICD-10-CM | POA: Diagnosis not present

## 2019-04-01 DIAGNOSIS — F329 Major depressive disorder, single episode, unspecified: Secondary | ICD-10-CM | POA: Diagnosis not present

## 2019-04-01 DIAGNOSIS — F419 Anxiety disorder, unspecified: Secondary | ICD-10-CM | POA: Diagnosis not present

## 2019-05-10 ENCOUNTER — Telehealth: Payer: BC Managed Care – PPO | Admitting: Physician Assistant

## 2019-05-10 DIAGNOSIS — B029 Zoster without complications: Secondary | ICD-10-CM

## 2019-05-10 MED ORDER — VALACYCLOVIR HCL 1 G PO TABS: 1000.0000 mg | ORAL_TABLET | Freq: Three times a day (TID) | ORAL | 0 refills | Status: DC

## 2019-05-10 NOTE — Progress Notes (Signed)
I have spent 5 minutes in review of e-visit questionnaire, review and updating patient chart, medical decision making and response to patient.   Yarima Penman Cody Carlota Philley, PA-C    

## 2019-05-10 NOTE — Progress Notes (Signed)
E-visit for Shingles   We are sorry that you are not feeling well. Here is how we plan to help!  Based on what you shared with me it looks like you have shingles.  Shingles or herpes zoster, is a common infection of the nerves.  It is a painful rash caused by the herpes zoster virus.  This is the same virus that causes chickenpox.  After a person has chickenpox, the virus remains inactive in the nerve cells.  Years later, the virus can become active again and travel to the skin.  It typically will appear on one side of the face or body.  Burning or shooting pain, tingling, or itching are early signs of the infection.  Blisters typically scab over in 7 to 10 days and clear up within 2-4 weeks. Shingles is only contagious to people that have never had the chickenpox, the chickenpox vaccine, or anyone who has a compromised immune system.  You should avoid contact with these type of people until your blisters scab over.  I have prescribed Valacyclovir 1g three times daily for 7 days   HOME CARE: Apply ice packs (wrapped in a thin towel), cool compresses, or soak in cool bath to help reduce pain. Use calamine lotion to calm itchy skin. Avoid scratching the rash. Avoid direct sunlight.  GET HELP RIGHT AWAY IF: Symptoms that don't away after treatment. A rash or blisters near your eye. Increased drainage, fever, or rash after treatment. Severe pain that doesn't go away.   MAKE SURE YOU   Understand these instructions. Will watch your condition. Will get help right away if you are not doing well or get worse.  Thank you for choosing an e-visit.  Your e-visit answers were reviewed by a board certified advanced clinical practitioner to complete your personal care plan. Depending upon the condition, your plan could have included both over the counter or prescription medications.  Please review your pharmacy choice. Make sure the pharmacy is open so you can pick up prescription now. If there is  a problem, you may contact your provider through MyChart messaging and have the prescription routed to another pharmacy.  Your safety is important to us. If you have drug allergies check your prescription carefully.   For the next 24 hours you can use MyChart to ask questions about today's visit, request a non-urgent call back, or ask for a work or school excuse. You will get an email in the next two days asking about your experience. I hope that your e-visit has been valuable and will speed your recovery.  

## 2019-05-13 ENCOUNTER — Telehealth: Payer: Self-pay

## 2019-05-13 NOTE — Telephone Encounter (Signed)
Patient advised and voiced understanding.  

## 2019-05-13 NOTE — Telephone Encounter (Signed)
When Spickard did e-visit with pt he dx'd him with shingles. He rx'd valtrex pills to take for 7 days.  This is the med used to treat shingles. No topical medication should be put on the rash until it is completely dry and scabbing over. Use 1000 mg tylenol three times a day for pain.

## 2019-05-13 NOTE — Telephone Encounter (Signed)
  Patient was seen by Einar Pheasant, Staley on 05/10/19 and given Rx for Valtrex 1000mg  TID for 7 days. Patient sent MyChart message stating "I think I have come down with the shingles. "I started haveong pain in my neck and shoulder on Thursday and Friday, II noticed a rash on my should Saturday morning and now it has spread with rising bumps, it is painful and sensitive to the touch." Patient would like to try a cream.  Please advise, thanks.

## 2019-06-05 DIAGNOSIS — G4733 Obstructive sleep apnea (adult) (pediatric): Secondary | ICD-10-CM | POA: Diagnosis not present

## 2019-06-12 DIAGNOSIS — G4733 Obstructive sleep apnea (adult) (pediatric): Secondary | ICD-10-CM | POA: Diagnosis not present

## 2019-07-07 DIAGNOSIS — H524 Presbyopia: Secondary | ICD-10-CM | POA: Diagnosis not present

## 2019-09-11 DIAGNOSIS — I10 Essential (primary) hypertension: Secondary | ICD-10-CM | POA: Diagnosis not present

## 2019-09-11 DIAGNOSIS — Z23 Encounter for immunization: Secondary | ICD-10-CM | POA: Diagnosis not present

## 2019-09-11 DIAGNOSIS — E785 Hyperlipidemia, unspecified: Secondary | ICD-10-CM | POA: Diagnosis not present

## 2019-09-11 DIAGNOSIS — F329 Major depressive disorder, single episode, unspecified: Secondary | ICD-10-CM | POA: Diagnosis not present

## 2019-09-14 DIAGNOSIS — M069 Rheumatoid arthritis, unspecified: Secondary | ICD-10-CM | POA: Diagnosis not present

## 2019-09-14 DIAGNOSIS — Z125 Encounter for screening for malignant neoplasm of prostate: Secondary | ICD-10-CM | POA: Diagnosis not present

## 2019-09-14 DIAGNOSIS — I10 Essential (primary) hypertension: Secondary | ICD-10-CM | POA: Diagnosis not present

## 2019-09-14 DIAGNOSIS — E785 Hyperlipidemia, unspecified: Secondary | ICD-10-CM | POA: Diagnosis not present

## 2019-10-15 ENCOUNTER — Encounter: Payer: Self-pay | Admitting: Family Medicine

## 2019-12-10 DIAGNOSIS — Z23 Encounter for immunization: Secondary | ICD-10-CM | POA: Diagnosis not present

## 2020-03-09 DIAGNOSIS — M47812 Spondylosis without myelopathy or radiculopathy, cervical region: Secondary | ICD-10-CM | POA: Diagnosis not present

## 2020-03-09 DIAGNOSIS — M542 Cervicalgia: Secondary | ICD-10-CM | POA: Diagnosis not present

## 2020-03-09 DIAGNOSIS — I10 Essential (primary) hypertension: Secondary | ICD-10-CM | POA: Diagnosis not present

## 2020-03-09 DIAGNOSIS — M545 Low back pain, unspecified: Secondary | ICD-10-CM | POA: Diagnosis not present

## 2020-03-18 ENCOUNTER — Other Ambulatory Visit: Payer: Self-pay | Admitting: Neurosurgery

## 2020-03-18 DIAGNOSIS — M542 Cervicalgia: Secondary | ICD-10-CM

## 2020-03-21 ENCOUNTER — Encounter: Payer: Self-pay | Admitting: Family Medicine

## 2020-04-04 DIAGNOSIS — R55 Syncope and collapse: Secondary | ICD-10-CM | POA: Diagnosis not present

## 2020-04-04 DIAGNOSIS — E559 Vitamin D deficiency, unspecified: Secondary | ICD-10-CM | POA: Diagnosis not present

## 2020-04-04 DIAGNOSIS — I10 Essential (primary) hypertension: Secondary | ICD-10-CM | POA: Diagnosis not present

## 2020-04-04 DIAGNOSIS — M79671 Pain in right foot: Secondary | ICD-10-CM | POA: Diagnosis not present

## 2020-04-04 DIAGNOSIS — G4733 Obstructive sleep apnea (adult) (pediatric): Secondary | ICD-10-CM | POA: Diagnosis not present

## 2020-05-05 ENCOUNTER — Telehealth (INDEPENDENT_AMBULATORY_CARE_PROVIDER_SITE_OTHER): Payer: BC Managed Care – PPO | Admitting: Family Medicine

## 2020-05-05 ENCOUNTER — Encounter: Payer: Self-pay | Admitting: Family Medicine

## 2020-05-05 VITALS — Ht 70.0 in | Wt 195.0 lb

## 2020-05-05 DIAGNOSIS — J209 Acute bronchitis, unspecified: Secondary | ICD-10-CM

## 2020-05-05 DIAGNOSIS — R059 Cough, unspecified: Secondary | ICD-10-CM

## 2020-05-05 MED ORDER — AMOXICILLIN-POT CLAVULANATE 875-125 MG PO TABS: 1.0000 | ORAL_TABLET | Freq: Two times a day (BID) | ORAL | 0 refills | Status: DC

## 2020-05-05 MED ORDER — BENZONATATE 200 MG PO CAPS: 200.0000 mg | ORAL_CAPSULE | Freq: Three times a day (TID) | ORAL | 0 refills | Status: DC

## 2020-05-05 NOTE — Patient Instructions (Signed)

## 2020-05-05 NOTE — Progress Notes (Signed)
VIRTUAL VISIT VIA VIDEO  I connected with Aaron Adams on 05/05/20 at  8:00 AM EST by elemedicine application and verified that I am speaking with the correct person using two identifiers. Location patient: Home Location provider: [facility], Office Persons participating in the virtual visit: Patient, Dr. Raoul Pitch and Samul Dada, CMA  I discussed the limitations of evaluation and management by telemedicine and the availability of in person appointments. The patient expressed understanding and agreed to proceed.   SUBJECTIVE Chief Complaint  Patient presents with  . Cough    Since 04/26/20, no fever or bodyaches  . Nasal Congestion    HPI: Aaron Adams is a 64 y.o. male present for acute visit concerning cough and and nasal congestion that started 10 days ago. Cough is mildly productive. Cough burns now. Chest feels heavy and he is weak.  He denies fever, chills, body aches, wheezing, shortness of breath He took a home covid test last week and this week, both negative.  He has not had his covid vaccines or flu shot.  OTC mucinex Dm and dayquil.  ROS: See pertinent positives and negatives per HPI.  Patient Active Problem List   Diagnosis Date Noted  . Obesity (BMI 30-39.9) 12/04/2018  . Fibromyalgia 11/29/2018  . DDD (degenerative disc disease), cervical 11/29/2018  . Complication of surgical procedure 11/29/2018  . Low back pain 11/29/2018  . Health maintenance examination 09/24/2013  . Thrombosed external hemorrhoid 03/23/2013  . Chronic fatigue 09/09/2012  . Neck nodule 08/25/2012  . Viral syndrome 05/12/2012  . Colon cancer screening 03/12/2012  . HTN (hypertension), benign 03/12/2012  . Prostate cancer screening 03/12/2012  . Hypogonadism, male 03/12/2012  . ASTHMA 06/01/2008  . OSTEOARTHRITIS 06/01/2008  . HYPERLIPIDEMIA 05/10/2008  . DEPRESSION 05/10/2008  . HYPERTENSION 05/10/2008    Social History   Tobacco Use  . Smoking status: Former  Smoker    Types: Cigarettes    Quit date: 05/07/1984    Years since quitting: 36.0  . Smokeless tobacco: Never Used  Substance Use Topics  . Alcohol use: No    Comment: Occasional    Current Outpatient Medications:  .  atorvastatin (LIPITOR) 40 MG tablet, Take 1 tablet (40 mg total) by mouth daily., Disp: 90 tablet, Rfl: 3 .  buPROPion (WELLBUTRIN SR) 150 MG 12 hr tablet, Take 150 mg by mouth daily., Disp: , Rfl:  .  ibuprofen (ADVIL,MOTRIN) 800 MG tablet, Take 1 tablet every 6 (six) hours as needed by mouth., Disp: , Rfl: 0 .  metoprolol succinate (TOPROL-XL) 50 MG 24 hr tablet, Take 1 tablet (50 mg total) by mouth daily. Take with or immediately following a meal., Disp: 90 tablet, Rfl: 3 .  Multiple Vitamins-Minerals (CENTRUM SILVER PO), Take 1 tablet by mouth daily., Disp: , Rfl:  .  SUMAtriptan (IMITREX) 100 MG tablet, Take 100 mg by mouth every 2 (two) hours as needed for migraine. May repeat in 2 hours if headache persists or recurs., Disp: , Rfl:  .  valACYclovir (VALTREX) 1000 MG tablet, Take 1 tablet (1,000 mg total) by mouth 3 (three) times daily., Disp: 21 tablet, Rfl: 0 .  clonazePAM (KLONOPIN) 0.5 MG tablet, Take 0.5 mg by mouth daily. Rx'ed by [facility] (Patient not taking: Reported on 05/05/2020), Disp: , Rfl:  .  gabapentin (NEURONTIN) 100 MG capsule, Take 300 mg by mouth 3 (three) times daily.  (Patient not taking: Reported on 05/05/2020), Disp: , Rfl:  .  sertraline (ZOLOFT) 100 MG tablet,  Take 100 mg by mouth daily. (Patient not taking: Reported on 05/05/2020), Disp: , Rfl:   No Known Allergies  OBJECTIVE: Ht 5\' 10"  (1.778 m)   Wt 195 lb (88.5 kg)   BMI 27.98 kg/m  Gen: No acute distress. Nontoxic in appearance.  HENT: AT. [place].  MMM.  Eyes:Pupils Equal Round Reactive to light, Extraocular movements intact,  Conjunctiva without redness, discharge or icterus. Chest: Cough present on exam, no shortness of breath Skin: no rashes, purpura or petechiae.  Neuro: Alert. Oriented x3     ASSESSMENT AND PLAN: Aaron Adams is a 64 y.o. male present for  Acute bronchitis with symptoms > 10 days/cough Rest, hydrate.  Continue mucinex (DM if cough) augmentin prescribed, take until completed.  Tessalon perles for cough If cough present it can last up to 6-8 weeks.  F/U 2 weeks of not improved.     65, DO 05/05/2020   No follow-ups on file.  No orders of the defined types were placed in this encounter.  No orders of the defined types were placed in this encounter.  Referral Orders  No referral(s) requested today

## 2020-05-09 ENCOUNTER — Encounter: Payer: Self-pay | Admitting: Family Medicine

## 2020-05-31 NOTE — Telephone Encounter (Signed)
Patient was seen by this provider approximately 4 weeks ago for bronchitis.  His PCP is Dr. Anitra Lauth. He was prescribed Augmentin and Tessalon Perles.  Encouraged to use Mucinex DM for cough.  If he is still having symptoms and cough has returned.  I would encourage him to use Mucinex DM and if the Tessalon Perles helped me refill Tessalon Perles for him.   Otherwise, he will need to follow-up with his primary care physician for further evaluation.  Any other types of medications that can be called in for cough are controlled substances and would require a face-to-face visit with his primary care provider.

## 2020-07-13 ENCOUNTER — Encounter: Payer: Self-pay | Admitting: Family Medicine

## 2020-07-14 DIAGNOSIS — Z0189 Encounter for other specified special examinations: Secondary | ICD-10-CM | POA: Diagnosis not present

## 2020-08-19 DIAGNOSIS — M62830 Muscle spasm of back: Secondary | ICD-10-CM | POA: Diagnosis not present

## 2020-08-19 DIAGNOSIS — I10 Essential (primary) hypertension: Secondary | ICD-10-CM | POA: Diagnosis not present

## 2020-08-19 DIAGNOSIS — M542 Cervicalgia: Secondary | ICD-10-CM | POA: Diagnosis not present

## 2020-08-19 DIAGNOSIS — M62838 Other muscle spasm: Secondary | ICD-10-CM | POA: Diagnosis not present

## 2020-09-15 ENCOUNTER — Other Ambulatory Visit: Payer: Self-pay | Admitting: Neurosurgery

## 2020-09-15 DIAGNOSIS — M542 Cervicalgia: Secondary | ICD-10-CM

## 2020-09-16 ENCOUNTER — Other Ambulatory Visit: Payer: Self-pay

## 2020-09-16 ENCOUNTER — Ambulatory Visit
Admission: RE | Admit: 2020-09-16 | Discharge: 2020-09-16 | Disposition: A | Discharge status: Home / Self Care | Payer: Non-veteran care | Source: Ambulatory Visit | Attending: Neurosurgery | Admitting: Neurosurgery

## 2020-09-16 DIAGNOSIS — M50221 Other cervical disc displacement at C4-C5 level: Secondary | ICD-10-CM | POA: Diagnosis not present

## 2020-09-16 DIAGNOSIS — M4803 Spinal stenosis, cervicothoracic region: Secondary | ICD-10-CM | POA: Diagnosis not present

## 2020-09-16 DIAGNOSIS — M5023 Other cervical disc displacement, cervicothoracic region: Secondary | ICD-10-CM | POA: Diagnosis not present

## 2020-09-16 DIAGNOSIS — M47813 Spondylosis without myelopathy or radiculopathy, cervicothoracic region: Secondary | ICD-10-CM | POA: Diagnosis not present

## 2020-09-16 DIAGNOSIS — M47812 Spondylosis without myelopathy or radiculopathy, cervical region: Secondary | ICD-10-CM | POA: Diagnosis not present

## 2020-09-16 DIAGNOSIS — M542 Cervicalgia: Secondary | ICD-10-CM

## 2020-09-16 DIAGNOSIS — M5021 Other cervical disc displacement,  high cervical region: Secondary | ICD-10-CM | POA: Diagnosis not present

## 2020-10-02 DIAGNOSIS — G4733 Obstructive sleep apnea (adult) (pediatric): Secondary | ICD-10-CM | POA: Diagnosis not present

## 2020-12-20 DIAGNOSIS — H524 Presbyopia: Secondary | ICD-10-CM | POA: Diagnosis not present

## 2021-03-01 DIAGNOSIS — N529 Male erectile dysfunction, unspecified: Secondary | ICD-10-CM | POA: Diagnosis not present

## 2021-03-01 DIAGNOSIS — J069 Acute upper respiratory infection, unspecified: Secondary | ICD-10-CM | POA: Diagnosis not present

## 2021-03-01 DIAGNOSIS — R7303 Prediabetes: Secondary | ICD-10-CM | POA: Diagnosis not present

## 2021-03-01 DIAGNOSIS — E785 Hyperlipidemia, unspecified: Secondary | ICD-10-CM | POA: Diagnosis not present

## 2021-03-01 DIAGNOSIS — I10 Essential (primary) hypertension: Secondary | ICD-10-CM | POA: Diagnosis not present

## 2021-03-01 DIAGNOSIS — E559 Vitamin D deficiency, unspecified: Secondary | ICD-10-CM | POA: Diagnosis not present

## 2021-03-27 DIAGNOSIS — M19011 Primary osteoarthritis, right shoulder: Secondary | ICD-10-CM | POA: Diagnosis not present

## 2021-04-27 DIAGNOSIS — M19011 Primary osteoarthritis, right shoulder: Secondary | ICD-10-CM | POA: Diagnosis not present

## 2021-04-27 DIAGNOSIS — M25711 Osteophyte, right shoulder: Secondary | ICD-10-CM | POA: Diagnosis not present

## 2021-05-03 ENCOUNTER — Telehealth: Payer: Self-pay | Admitting: Family Medicine

## 2021-05-03 NOTE — Telephone Encounter (Signed)
Pt came in office on 05/03/21 wanting schedule for colonoscopy.-KR

## 2021-05-03 NOTE — Telephone Encounter (Signed)
Pt's last f/u was 12/04/18, confirmed with pt he still receives CPE thru [place]. Last colonoscopy was 06/23/12   Please review and advise

## 2021-05-03 NOTE — Telephone Encounter (Signed)
Reviewed chart, patient had initial screening colonoscopy in 2014.  No polyps.  It was recommended at that time that he have a repeat colonoscopy in 10 years. Therefore, please inform [name] that he does not need to get a repeat colonoscopy until 2024.

## 2021-05-03 NOTE — Telephone Encounter (Signed)
Pt advised of recommendations.  

## 2021-06-13 DIAGNOSIS — M25512 Pain in left shoulder: Secondary | ICD-10-CM | POA: Diagnosis not present

## 2021-06-13 DIAGNOSIS — M25511 Pain in right shoulder: Secondary | ICD-10-CM | POA: Diagnosis not present

## 2021-06-20 DIAGNOSIS — M25511 Pain in right shoulder: Secondary | ICD-10-CM | POA: Diagnosis not present

## 2021-07-11 DIAGNOSIS — M25511 Pain in right shoulder: Secondary | ICD-10-CM | POA: Diagnosis not present

## 2021-08-30 DIAGNOSIS — R7303 Prediabetes: Secondary | ICD-10-CM | POA: Diagnosis not present

## 2021-08-30 DIAGNOSIS — E785 Hyperlipidemia, unspecified: Secondary | ICD-10-CM | POA: Diagnosis not present

## 2021-08-30 DIAGNOSIS — I10 Essential (primary) hypertension: Secondary | ICD-10-CM | POA: Diagnosis not present

## 2021-08-30 DIAGNOSIS — N529 Male erectile dysfunction, unspecified: Secondary | ICD-10-CM | POA: Diagnosis not present

## 2022-05-10 ENCOUNTER — Encounter: Payer: Self-pay | Admitting: Gastroenterology

## 2022-05-28 ENCOUNTER — Ambulatory Visit (AMBULATORY_SURGERY_CENTER): Payer: Non-veteran care | Admitting: *Deleted

## 2022-05-28 VITALS — Ht 69.5 in | Wt 195.0 lb

## 2022-05-28 DIAGNOSIS — Z1211 Encounter for screening for malignant neoplasm of colon: Secondary | ICD-10-CM

## 2022-05-28 NOTE — Progress Notes (Signed)
No egg or soy allergy known to patient  No issues known to pt with past sedation with any surgeries or procedures Patient denies ever being told they had issues or difficulty with intubation  No FH of Malignant Hyperthermia Pt is not on diet pills Pt is not on  home 02  Pt is not on blood thinners  Pt denies issues with constipation  Pt is not on dialysis Pt denies any upcoming cardiac testing Pt encouraged to use to use Singlecare or Goodrx to reduce cost  Patient's chart reviewed by Osvaldo Angst CNRA prior to previsit and patient appropriate for the [facility].  Previsit completed and red dot placed by patient's name on their procedure day (on provider's schedule).  [facility] prep (Moviprep) so has at home. Visit by phone Instructions sent by mail and my chart

## 2022-06-28 ENCOUNTER — Encounter: Payer: Self-pay | Admitting: Gastroenterology

## 2022-06-28 ENCOUNTER — Ambulatory Visit (AMBULATORY_SURGERY_CENTER): Payer: No Typology Code available for payment source | Admitting: Gastroenterology

## 2022-06-28 VITALS — BP 126/72 | HR 59 | Temp 98.6°F | Resp 13 | Ht 69.5 in | Wt 195.0 lb

## 2022-06-28 DIAGNOSIS — Z1211 Encounter for screening for malignant neoplasm of colon: Secondary | ICD-10-CM | POA: Diagnosis present

## 2022-06-28 DIAGNOSIS — K6389 Other specified diseases of intestine: Secondary | ICD-10-CM | POA: Diagnosis not present

## 2022-06-28 DIAGNOSIS — D120 Benign neoplasm of cecum: Secondary | ICD-10-CM | POA: Diagnosis not present

## 2022-06-28 MED ORDER — SODIUM CHLORIDE 0.9 % IV SOLN: 500.0000 mL | Freq: Once | INTRAVENOUS | Status: DC

## 2022-06-28 NOTE — Progress Notes (Signed)
Pt's states no medical or surgical changes since previsit or office visit. VS assessed by N.[place] ?

## 2022-06-28 NOTE — Op Note (Signed)
[facility] Patient Name: Aaron Adams Procedure Date: 06/28/2022 10:06 AM MRN: [id number] Endoscopist: Nicki Reaper E. Candis Schatz , MD, [id number] Age: 67 Referring MD:  Date of Birth: Jul 08, 1955 Gender: Male Account #: [id number] Procedure:                Colonoscopy Indications:              Screening for colorectal malignant neoplasm (last                            colonoscopy was 10 years ago) Medicines:                Monitored Anesthesia Care Procedure:                Pre-Anesthesia Assessment:                           - Prior to the procedure, a History and Physical                            was performed, and patient medications and                            allergies were reviewed. The patient's tolerance of                            previous anesthesia was also reviewed. The risks                            and benefits of the procedure and the sedation                            options and risks were discussed with the patient.                            All questions were answered, and informed consent                            was obtained. Prior Anticoagulants: The patient has                            taken no anticoagulant or antiplatelet agents. ASA                            Grade Assessment: II - A patient with mild systemic                            disease. After reviewing the risks and benefits,                            the patient was deemed in satisfactory condition to                            undergo the procedure.  After obtaining informed consent, the colonoscope                            was passed under direct vision. Throughout the                            procedure, the patient's blood pressure, pulse, and                            oxygen saturations were monitored continuously. The                            CF [id number] was introduced through the anus                            and advanced to the  the terminal ileum, with                            identification of the appendiceal orifice and IC                            valve. The colonoscopy was performed without                            difficulty. The patient tolerated the procedure                            well. The quality of the bowel preparation was                            good. The terminal ileum, ileocecal valve,                            appendiceal orifice, and rectum were photographed.                            The bowel preparation used was SUPREP via split                            dose instruction. Scope In: 10:16:37 AM Scope Out: 10:32:11 AM Scope Withdrawal Time: 0 hours 11 minutes 19 seconds  Total Procedure Duration: 0 hours 15 minutes 34 seconds  Findings:                 The perianal and digital rectal examinations were                            normal. Pertinent negatives include normal                            sphincter tone and no palpable rectal lesions.                           A 2 mm polyp was found in the cecum. The polyp was  sessile. The polyp was removed with a cold snare.                            Resection and retrieval were complete. Estimated                            blood loss was minimal.                           The exam was otherwise normal throughout the                            examined colon.                           The terminal ileum appeared normal.                           The retroflexed view of the distal rectum and anal                            verge was normal and showed no anal or rectal                            abnormalities. Complications:            No immediate complications. Estimated Blood Loss:     Estimated blood loss was minimal. Impression:               - One 2 mm polyp in the cecum, removed with a cold                            snare. Resected and retrieved.                           - The examined portion of the  ileum was normal.                           - The distal rectum and anal verge are normal on                            retroflexion view. Recommendation:           - Patient has a contact number available for                            emergencies. The signs and symptoms of potential                            delayed complications were discussed with the                            patient. Return to normal activities tomorrow.                            Written discharge instructions  were provided to the                            patient.                           - Resume previous diet.                           - Repeat colonoscopy (date not yet determined) for                            surveillance based on pathology results. Sharhonda Atwood E. Candis Schatz, MD 06/28/2022 10:39:31 AM This report has been signed electronically.
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
## 2022-06-28 NOTE — Progress Notes (Signed)
To pacu, VSS. Report to Rn,tb

## 2022-06-28 NOTE — Patient Instructions (Signed)
Await pathology results.  See MD's procedure report for additional information and/or recommendations.  YOU HAD AN ENDOSCOPIC PROCEDURE TODAY AT [facility]:   Refer to the procedure report that was given to you for any specific questions about what was found during the examination.  If the procedure report does not answer your questions, please call your gastroenterologist to clarify.  If you requested that your care partner not be given the details of your procedure findings, then the procedure report has been included in a sealed envelope for you to review at your convenience later.  YOU SHOULD EXPECT: Some feelings of bloating in the abdomen. Passage of more gas than usual.  Walking can help get rid of the air that was put into your GI tract during the procedure and reduce the bloating. If you had a lower endoscopy (such as a colonoscopy or flexible sigmoidoscopy) you may notice spotting of blood in your stool or on the toilet paper. If you underwent a bowel prep for your procedure, you may not have a normal bowel movement for a few days.  Please Note:  You might notice some irritation and congestion in your nose or some drainage.  This is from the oxygen used during your procedure.  There is no need for concern and it should clear up in a day or so.  SYMPTOMS TO REPORT IMMEDIATELY:  Following lower endoscopy (colonoscopy or flexible sigmoidoscopy):  Excessive amounts of blood in the stool  Significant tenderness or worsening of abdominal pains  Swelling of the abdomen that is new, acute  Fever of 100F or higher   For urgent or emergent issues, a gastroenterologist can be reached at any hour by calling [phone]. Do not use MyChart messaging for urgent concerns.    DIET:  We do recommend a small meal at first, but then you may proceed to your regular diet.  Drink plenty of fluids but you should avoid alcoholic beverages for 24 hours.  ACTIVITY:  You should plan to  take it easy for the rest of today and you should NOT DRIVE or use heavy machinery until tomorrow (because of the sedation medicines used during the test).    FOLLOW UP: Our staff will call the number listed on your records the next business day following your procedure.  We will call around 7:15- 8:00 am to check on you and address any questions or concerns that you may have regarding the information given to you following your procedure. If we do not reach you, we will leave a message.     If any biopsies were taken you will be contacted by phone or by letter within the next 1-3 weeks.  Please call us at [phone] if you have not heard about the biopsies in 3 weeks.    SIGNATURES/CONFIDENTIALITY: You and/or your care partner have signed paperwork which will be entered into your electronic medical record.  These signatures attest to the fact that that the information above on your After Visit Summary has been reviewed and is understood.  Full responsibility of the confidentiality of this discharge information lies with you and/or your care-partner.

## 2022-06-29 ENCOUNTER — Telehealth: Payer: Self-pay

## 2022-06-29 NOTE — Telephone Encounter (Signed)
Follow up call placed, VM obtained and message left. 

## 2022-07-05 NOTE — Progress Notes (Signed)
Mr. Ravenscraft,  Good news: the polyp  that I removed during your recent examination was NOT precancerous.  You should continue to follow current colorectal cancer screening guidelines with a repeat colonoscopy in 10 years.    If you develop any new rectal bleeding, abdominal pain or significant bowel habit changes, please contact me before then.

## 2023-01-06 HISTORY — PX: COLONOSCOPY: SHX5424

## 2023-08-27 ENCOUNTER — Ambulatory Visit: Payer: Self-pay

## 2023-08-27 ENCOUNTER — Telehealth: Payer: Self-pay

## 2023-08-27 NOTE — Telephone Encounter (Signed)
   Chief Complaint: SOB Symptoms: cough, mucus  Disposition: [] ED /[x] Urgent Care (no appt availability in office) / [] Appointment(In office/virtual)/ []  [facility]/ [] Home Care/ [] Refused Recommended Disposition /[] [facility]/ []  Follow-up with PCP Additional Notes: Pt called with SOB, cough, and thick mucus since 4/11. Pt states the breathing is shallow at all times. Pt denies fever. Pt feels weak at times. Pt is not established with a practice.  RN advised he call back and set up new pt appt and pt verbalized he would. RN advised he be evaluated at urgent care today.  Pt stated he will go.              Copied from CRM [id number]. Topic: Clinical - Red Word Triage >> Aug 27, 2023 11:02 AM Allyne Areola wrote: Red Word that prompted transfer to Nurse Triage: Patient has been experiencing respiratory concerns,difficulty breathing, mucus discharge, fatigue and drowsiness. Reason for Disposition  [1] MILD difficulty breathing (e.g., minimal/no SOB at rest, SOB with walking, pulse <100) AND [2] NEW-onset or WORSE than normal  Answer Assessment - Initial Assessment Questions 1. RESPIRATORY STATUS: "Describe your breathing?" (e.g., wheezing, shortness of breath, unable to speak, severe coughing)      Shallow breathing 2. ONSET: "When did this breathing problem begin?"      4/14 3. PATTERN "Does the difficult breathing come and go, or has it been constant since it started?"      Constant  4. SEVERITY: "How bad is your breathing?" (e.g., mild, moderate, severe)    - MILD: No SOB at rest, mild SOB with walking, speaks normally in sentences, can lie down, no retractions, pulse < 100.    - MODERATE: SOB at rest, SOB with minimal exertion and prefers to sit, cannot lie down flat, speaks in phrases, mild retractions, audible wheezing, pulse 100-120.    - SEVERE: Very SOB at rest, speaks in single words, struggling to breathe, sitting hunched forward, retractions, pulse > 120       moderate 5. RECURRENT SYMPTOM: "Have you had difficulty breathing before?" If Yes, ask: "When was the last time?" and "What happened that time?"      Yes, several years ago 6. CARDIAC HISTORY: "Do you have any history of heart disease?" (e.g., heart attack, angina, bypass surgery, angioplasty)      denies 7. LUNG HISTORY: "Do you have any history of lung disease?"  (e.g., pulmonary embolus, asthma, emphysema)     Not sure 8. CAUSE: "What do you think is causing the breathing problem?"      Not sure 9. OTHER SYMPTOMS: "Do you have any other symptoms? (e.g., dizziness, runny nose, cough, chest pain, fever)     Mucus, cough,  Protocols used: Breathing Difficulty-A-AH

## 2023-08-27 NOTE — Telephone Encounter (Signed)
 Received surgical clearance form. Pt is no longer a pt in [facility] ([place])

## 2023-09-09 ENCOUNTER — Telehealth: Payer: Self-pay

## 2023-09-09 NOTE — Telephone Encounter (Signed)
 Note faxed back to ortho. Pt no longer under our care

## 2023-09-13 NOTE — Progress Notes (Signed)
 Surgery orders requested via Epic inbox.

## 2023-09-19 NOTE — Patient Instructions (Signed)
 SURGICAL WAITING ROOM VISITATION Patients having surgery or a procedure may have no more than 2 support people in the waiting area - these visitors may rotate in the visitor waiting room.   If the patient needs to stay at the hospital during part of their recovery, the visitor guidelines for inpatient rooms apply.  PRE-OP VISITATION  Pre-op nurse will coordinate an appropriate time for 1 support person to accompany the patient in pre-op.  This support person may not rotate.  This visitor will be contacted when the time is appropriate for the visitor to come back in the pre-op area.  Please refer to the [facility] website for the visitor guidelines for Inpatients (after your surgery is over and you are in a regular room).  You are not required to quarantine at this time prior to your surgery. However, you must do this: Hand Hygiene often Do NOT share personal items Notify your provider if you are in close contact with someone who has COVID or you develop fever 100.4 or greater, new onset of sneezing, cough, sore throat, shortness of breath or body aches.  If you test positive for Covid or have been in contact with anyone that has tested positive in the last 10 days please notify you surgeon.    Your procedure is scheduled on:  [facility]  Oct 02, 2023  Report to [facility] Main Entrance: [address] entrance where the [employer] is available.   Report to admitting at:05:15 AM  Call this number if you have any questions or problems the morning of surgery [phone]  Do not eat or drink anything after Midnight the night prior to your surgery/procedure.  FOLLOW ANY ADDITIONAL PRE OP INSTRUCTIONS YOU RECEIVED FROM YOUR SURGEON'S OFFICE!!!   Oral Hygiene is also important to reduce your risk of infection.        Remember - BRUSH YOUR TEETH THE MORNING OF SURGERY WITH YOUR REGULAR TOOTHPASTE  Do NOT smoke after Midnight the night before surgery.  STOP TAKING all Vitamins, Herbs  and supplements 1 week before your surgery.   ASPIRIN- stop taking 5-7 days before surgery.  Take ONLY these medicines the morning of surgery with A SIP OF WATER: Metoprolol , bupropion, pregabalin, and you may use your Albuterol  inhaler if needed.   If You have been diagnosed with Sleep Apnea - Bring CPAP mask and tubing day of surgery. We will provide you with a CPAP machine on the day of your surgery.                   You may not have any metal on your body including  jewelry, and body piercing  Do not wear lotions, powders, cologne, or deodorant  Men may shave face and neck.  Contacts, Hearing Aids, dentures or bridgework may not be worn into surgery. DENTURES WILL BE REMOVED PRIOR TO SURGERY PLEASE DO NOT APPLY "Poly grip" OR ADHESIVES!!!   Patients discharged on the day of surgery will not be allowed to drive home.  Someone NEEDS to stay with you for the first 24 hours after anesthesia.  Do not bring your home medications to the hospital. The Pharmacy will dispense medications listed on your medication list to you during your admission in the Hospital.  Please read over the following fact sheets you were given: IF YOU HAVE QUESTIONS ABOUT YOUR PRE-OP INSTRUCTIONS, PLEASE CALL [phone].     Pre-operative 5 CHG Bath Instructions   You can play a key role in reducing  the risk of infection after surgery. Your skin needs to be as free of germs as possible. You can reduce the number of germs on your skin by washing with CHG (chlorhexidine gluconate) soap before surgery. CHG is an antiseptic soap that kills germs and continues to kill germs even after washing.   DO NOT use if you have an allergy to chlorhexidine/CHG or antibacterial soaps. If your skin becomes reddened or irritated, stop using the CHG and notify one of our RNs at [phone]  Please shower with the CHG soap starting 4 days before surgery using the following schedule: START SHOWERS ON    SATURDAY  Sep 28, 2023                                                                                                                                                                               Please keep in mind the following:  DO NOT shave, including legs and underarms, starting the day of your first shower.   You may shave your face at any point before/day of surgery.   Place clean sheets on your bed the day you start using CHG soap. Use a clean washcloth (not used since being washed) for each shower. DO NOT sleep with pets once you start using the CHG.   CHG Shower Instructions:  If you choose to wash your hair and private area, wash first with your normal shampoo/soap.  After you use shampoo/soap, rinse your hair and body thoroughly to remove shampoo/soap residue.  Turn the water OFF and apply about 3 tablespoons (45 ml) of CHG soap to a CLEAN washcloth.  Apply CHG soap ONLY FROM YOUR NECK DOWN TO YOUR TOES (washing for 3-5 minutes)  DO NOT use CHG soap on face, private areas, open wounds, or sores.  Pay special attention to the area where your surgery is being performed.  If you are having back surgery, having someone wash your back for you may be helpful.  Wait 2 minutes after CHG soap is applied, then you may rinse off the CHG soap.  Pat dry with a clean towel  Put on clean clothes/pajamas   If you choose to wear lotion, please use ONLY the CHG-compatible lotions on the back of this paper.     Additional instructions for the day of surgery: DO NOT APPLY any lotions, deodorants, cologne, or perfumes.   Put on clean/comfortable clothes.  Brush your teeth.  Ask your nurse before applying any prescription medications to the skin.      CHG Compatible Lotions   Aveeno Moisturizing lotion  Cetaphil Moisturizing Cream  Cetaphil Moisturizing Lotion  Clairol Herbal Essence Moisturizing Lotion, Dry Skin  Clairol Herbal Essence Moisturizing  Lotion, Extra Dry Skin  Clairol Herbal Essence Moisturizing  Lotion, Normal Skin  Curel Age Defying Therapeutic Moisturizing Lotion with Alpha Hydroxy  Curel Extreme Care Body Lotion  Curel Soothing Hands Moisturizing Hand Lotion  Curel Therapeutic Moisturizing Cream, Fragrance-Free  Curel Therapeutic Moisturizing Lotion, Fragrance-Free  Curel Therapeutic Moisturizing Lotion, Original Formula  Eucerin Daily Replenishing Lotion  Eucerin Dry Skin Therapy Plus Alpha Hydroxy Crme  Eucerin Dry Skin Therapy Plus Alpha Hydroxy Lotion  Eucerin Original Crme  Eucerin Original Lotion  Eucerin Plus Crme Eucerin Plus Lotion  Eucerin TriLipid Replenishing Lotion  Keri Anti-Bacterial Hand Lotion  Keri Deep Conditioning Original Lotion Dry Skin Formula Softly Scented  Keri Deep Conditioning Original Lotion, Fragrance Free Sensitive Skin Formula  Keri Lotion Fast Absorbing Fragrance Free Sensitive Skin Formula  Keri Lotion Fast Absorbing Softly Scented Dry Skin Formula  Keri Original Lotion  Keri Skin Renewal Lotion Keri Silky Smooth Lotion  Keri Silky Smooth Sensitive Skin Lotion  Nivea Body Creamy Conditioning Oil  Nivea Body Extra Enriched Lotion  Nivea Body Original Lotion  Nivea Body Sheer Moisturizing Lotion Nivea Crme  Nivea Skin Firming Lotion  NutraDerm 30 Skin Lotion  NutraDerm Skin Lotion  NutraDerm Therapeutic Skin Cream  NutraDerm Therapeutic Skin Lotion  ProShield Protective Hand Cream  Provon moisturizing lotion      Preparing for Total Shoulder Arthroplasty ================================================================= Please follow these instructions carefully, in addition to any other special Bathing information that was explained to you at the Presurgical Appointment:  BENZOYL PEROXIDE 5% GEL: Used to kill bacteria on the skin which could cause an infection at the surgery site.   Please do not use if you have an allergy to benzoyl peroxide. If your skin becomes reddened/irritated stop using the benzoyl peroxide and inform  your Doctor.   Starting two days before surgery, apply as follows:  1. Apply benzoyl peroxide gel in the morning and at night. Apply after taking a shower. If you are not taking a shower, clean entire shoulder front, back, and side, along with the armpit with a clean wet washcloth.  2. Place a quarter-sized dollop of the gel on your SHOULDER and rub in thoroughly, making sure to cover the front, back, and side of your shoulder, along with the armpit.   2 Days prior to Surgery  MONDAY Sep 30, 2023 First Application _______ Morning Second Application _______ Night  Day Before Surgery   TUESDAY  Oct 01, 2023 First Application______ Morning  On the night before surgery, wash your entire body (except hair, face and private areas) with CHG Soap. THEN, rub in the LAST application of the Benzoyl Peroxide Gel on your shoulder.   3. On the Morning of Surgery wash your BODY AGAIN with CHG Soap (except hair, face and private areas)  4. DO NOT USE THE BENZOYL PEROXIDE GEL ON THE DAY OF YOUR SURGERY      FAILURE TO FOLLOW THESE INSTRUCTIONS MAY RESULT IN THE CANCELLATION OF YOUR SURGERY  PATIENT SIGNATURE_________________________________  NURSE SIGNATURE__________________________________  ________________________________________________________________________       Benjamen Brand    An incentive spirometer is a tool that can help keep your lungs clear and active. This tool measures how well you are filling your lungs with each breath. Taking long deep breaths may help reverse or decrease the chance of developing breathing (pulmonary) problems (especially infection) following: A long period of time when you are unable to move or be active. BEFORE THE PROCEDURE  If the spirometer includes an indicator to  show your best effort, your nurse or respiratory therapist will set it to a desired goal. If possible, sit up straight or lean slightly forward. Try not to slouch. Hold the  incentive spirometer in an upright position. INSTRUCTIONS FOR USE  Sit on the edge of your bed if possible, or sit up as far as you can in bed or on a chair. Hold the incentive spirometer in an upright position. Breathe out normally. Place the mouthpiece in your mouth and seal your lips tightly around it. Breathe in slowly and as deeply as possible, raising the piston or the ball toward the top of the column. Hold your breath for 3-5 seconds or for as long as possible. Allow the piston or ball to fall to the bottom of the column. Remove the mouthpiece from your mouth and breathe out normally. Rest for a few seconds and repeat Steps 1 through 7 at least 10 times every 1-2 hours when you are awake. Take your time and take a few normal breaths between deep breaths. The spirometer may include an indicator to show your best effort. Use the indicator as a goal to work toward during each repetition. After each set of 10 deep breaths, practice coughing to be sure your lungs are clear. If you have an incision (the cut made at the time of surgery), support your incision when coughing by placing a pillow or rolled up towels firmly against it. Once you are able to get out of bed, walk around indoors and cough well. You may stop using the incentive spirometer when instructed by your caregiver.  RISKS AND COMPLICATIONS Take your time so you do not get dizzy or light-headed. If you are in pain, you may need to take or ask for pain medication before doing incentive spirometry. It is harder to take a deep breath if you are having pain. AFTER USE Rest and breathe slowly and easily. It can be helpful to keep track of a log of your progress. Your caregiver can provide you with a simple table to help with this. If you are using the spirometer at home, follow these instructions: SEEK MEDICAL CARE IF:  You are having difficultly using the spirometer. You have trouble using the spirometer as often as instructed. Your  pain medication is not giving enough relief while using the spirometer. You develop fever of 100.5 F (38.1 C) or higher.                                                                                                    SEEK IMMEDIATE MEDICAL CARE IF:  You cough up bloody sputum that had not been present before. You develop fever of 102 F (38.9 C) or greater. You develop worsening pain at or near the incision site. MAKE SURE YOU:  Understand these instructions. Will watch your condition. Will get help right away if you are not doing well or get worse. Document Released: 09/03/2006 Document Revised: 07/16/2011 Document Reviewed: 11/04/2006 [facility] Patient Information 2014 [place], [place].        If you would like to see a  video about joint replacement:   [email or web address]

## 2023-09-19 NOTE — Progress Notes (Addendum)
 COVID Vaccine received:  [x]  No []  Yes Date of any COVID positive Test in last 90 days:  none  PCP - Dudley Ghee, MD at  [facility] clinic  [phone]  Clearance scanned to Media on 09-23-23 Cardiologist - none  Chest x-ray - 11-16-2014  2v  Epic EKG -  09-05-2016 Epic  will repeat Stress Test -  ECHO -  Cardiac Cath -   Pacemaker / ICD device [x]  No []  Yes   Spinal Cord Stimulator:[x]  No []  Yes       History of Sleep Apnea? []  No [x]  Yes   CPAP used?- [x]  No []  Yes    Does the patient monitor blood sugar?   []  N/A   [x]  No []  Yes  Patient has: []  NO Hx DM   [x]  Pre-DM   []  DM1  []   DM2 Last A1c was:  5.8 on 09-09-2023   at [facility] clinic    Blood Thinner / Instructions:  none Aspirin Instructions:  ASA 81 mg  stopped on 09-20-23  ERAS Protocol Ordered: [x]  No  []  Yes Patient is to be NPO after:  [place]  Dental hx: []  Dentures:  []  N/A      []  Bridge or Partial: Permanent bridge on the bottom right side                  []  Loose or Damaged teeth:   Comments: CBC w/diff, CMP, and A1c were drawn at the [facility] clinic on 09-09-23. (On Chart)  Activity level: Patient is able to climb a flight of stairs without difficulty; [x]  No CP  [x]  No SOB. Patient can perform ADLs without assistance.   Anesthesia review: OSA-CPAP, ADHD, HTN, depression, Pre-DM no meds, ETOH, ACDF x 2 (C5-7 in 1996 in [place]  and C4-5 done here 2009 Dr. Nigel Bart   3 levels total)   Patient denies shortness of breath, fever, cough and chest pain at PAT appointment.  Patient verbalized understanding and agreement to the Pre-Surgical Instructions that were given to them at this PAT appointment. Patient was also educated of the need to review these PAT instructions again prior to his surgery.I reviewed the appropriate phone numbers to call if they have any and questions or concerns.

## 2023-09-20 ENCOUNTER — Encounter (HOSPITAL_COMMUNITY): Payer: Self-pay

## 2023-09-20 ENCOUNTER — Encounter (HOSPITAL_COMMUNITY)
Admission: RE | Admit: 2023-09-20 | Discharge: 2023-09-20 | Disposition: A | Discharge status: Home / Self Care | Source: Ambulatory Visit | Attending: Orthopaedic Surgery | Admitting: Orthopaedic Surgery

## 2023-09-20 ENCOUNTER — Other Ambulatory Visit: Payer: Self-pay

## 2023-09-20 VITALS — BP 127/76 | HR 64 | Temp 98.4°F | Resp 16 | Ht 70.0 in | Wt 202.0 lb

## 2023-09-20 DIAGNOSIS — I10 Essential (primary) hypertension: Secondary | ICD-10-CM

## 2023-09-20 DIAGNOSIS — Z01810 Encounter for preprocedural cardiovascular examination: Secondary | ICD-10-CM | POA: Diagnosis present

## 2023-09-20 DIAGNOSIS — Z01818 Encounter for other preprocedural examination: Secondary | ICD-10-CM

## 2023-09-20 DIAGNOSIS — R001 Bradycardia, unspecified: Secondary | ICD-10-CM | POA: Diagnosis not present

## 2023-09-20 DIAGNOSIS — R7303 Prediabetes: Secondary | ICD-10-CM

## 2023-09-20 DIAGNOSIS — Z01812 Encounter for preprocedural laboratory examination: Secondary | ICD-10-CM | POA: Diagnosis present

## 2023-09-20 HISTORY — DX: Myoneural disorder, unspecified: G70.9

## 2023-09-20 HISTORY — DX: Prediabetes: R73.03

## 2023-09-20 HISTORY — DX: Family history of other specified conditions: Z84.89

## 2023-09-20 LAB — SURGICAL PCR SCREEN
MRSA, PCR: NEGATIVE
Staphylococcus aureus: NEGATIVE
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
## 2023-10-01 NOTE — Discharge Instructions (Signed)
 Grafton Lawrence MD, MPH Nicholas Bari, PA-C [facility] [address] [phone] (tel)   [phone] (fax)   POST-OPERATIVE INSTRUCTIONS - TOTAL SHOULDER REPLACEMENT    WOUND CARE You may leave the operative dressing in place until your follow-up appointment. KEEP THE INCISIONS CLEAN AND DRY. There may be a small amount of fluid/bleeding leaking at the surgical site. This is normal after surgery.  If it fills with liquid or blood please call us  immediately to change it for you. Use the provided ice machine or Ice packs as often as possible for the first 3-4 days, then as needed for pain relief.   Keep a layer of cloth or a shirt between your skin and the cooling unit to prevent frost bite as it can get very cold.  SHOWERING: - You may shower on Post-Op Day #2.  - The dressing is water resistant but do not scrub it as it may start to peel up.   - You may remove the sling for showering - Gently pat the area dry.  - Do not soak the shoulder in water.  - Do not go swimming in the pool or ocean until your incision has completely healed (about 4-6 weeks after surgery) - KEEP THE INCISIONS CLEAN AND DRY.  EXERCISES Wear the sling at all times  You may remove the sling for showering, but keep the arm across the chest or in a secondary sling.    Accidental/Purposeful External Rotation and shoulder flexion (reaching behind you) is to be avoided at all costs for the first month. It is ok to come out of your sling if your are sitting and have assistance for eating.   Do not lift anything heavier than 1 pound until we discuss it further in clinic.  It is normal for your fingers/hand to become more swollen after surgery and discolored from bruising.   This will resolve over the first few weeks usually after surgery. Please continue to ambulate and do not stay sitting or lying for too long.  Perform foot and wrist pumps to assist in circulation.  PHYSICAL  THERAPY - You will begin physical therapy soon after surgery (unless otherwise specified) - Please call to set up an appointment, if you do not already have one  - Let our office if there are any issues with scheduling your therapy   - A PT referral was sent to [facility]  REGIONAL ANESTHESIA (NERVE BLOCKS) The anesthesia team may have performed a nerve block for you this is a great tool used to minimize pain.   The block may start wearing off overnight (between 8-24 hours postop) When the block wears off, your pain may go from nearly zero to the pain you would have had postop without the block. This is an abrupt transition but nothing dangerous is happening.   This can be a challenging period but utilize your as needed pain medications to try and manage this period. We suggest you use the pain medication the first night prior to going to bed, to ease this transition.  You may take an extra dose of narcotic when this happens if needed   POST-OP MEDICATIONS- Multimodal approach to pain control In general your pain will be controlled with a combination of substances.  Prescriptions unless otherwise discussed are electronically sent to your pharmacy.  This is a carefully made plan we use to minimize narcotic use.     Celebrex - Anti-inflammatory medication taken on a scheduled  basis Acetaminophen  - Non-narcotic pain medicine taken on a scheduled basis  Oxycodone  - This is a strong narcotic, to be used only on an "as needed" basis for SEVERE pain. Aspirin  81mg  - This medicine is used to minimize the risk of blood clots after surgery. Zofran  -  take as needed for nausea   FOLLOW-UP If you develop a Fever (>101.5), Redness or Drainage from the surgical incision site, please call our office to arrange for an evaluation. Please call the office to schedule a follow-up appointment for a wound check, 7-10 days post-operatively.  IF YOU HAVE ANY QUESTIONS, PLEASE FEEL FREE TO CALL OUR  OFFICE.  HELPFUL INFORMATION  Your arm will be in a sling following surgery. You will be in this sling for the next 4 weeks.   You may be more comfortable sleeping in a semi-seated position the first few nights following surgery.  Keep a pillow propped under the elbow and forearm for comfort.  If you have a recliner type of chair it might be beneficial.  If not that is fine too, but it would be helpful to sleep propped up with pillows behind your operated shoulder as well under your elbow and forearm.  This will reduce pulling on the suture lines.  When dressing, put your operative arm in the sleeve first.  When getting undressed, take your operative arm out last.  Loose fitting, button-down shirts are recommended.  In most states it is against the law to drive while your arm is in a sling. And certainly against the law to drive while taking narcotics.  You may return to work/school in the next couple of days when you feel up to it. Desk work and typing in the sling is fine.  We suggest you use the pain medication the first night prior to going to bed, in order to ease any pain when the anesthesia wears off. You should avoid taking pain medications on an empty stomach as it will make you nauseous.  You should wean off your narcotic medicines as soon as you are able.     Most patients will be off narcotics before their first postop appointment.   Do not drink alcoholic beverages or take illicit drugs when taking pain medications.  Pain medication may make you constipated.  Below are a few solutions to try in this order: Decrease the amount of pain medication if you aren't having pain. Drink lots of decaffeinated fluids. Drink prune juice and/or each dried prunes  If the first 3 don't work start with additional solutions Take Colace - an over-the-counter stool softener Take Senokot - an over-the-counter laxative Take Miralax - a stronger over-the-counter laxative   Dental  Antibiotics:  In most cases prophylactic antibiotics for Dental procdeures after total joint surgery are not necessary.  Exceptions are as follows:  1. History of prior total joint infection  2. Severely immunocompromised (Organ Transplant, cancer chemotherapy, Rheumatoid biologic meds such as Humira)  3. Poorly controlled diabetes (A1C &gt; 8.0, blood glucose over 200)  If you have one of these conditions, contact your surgeon for an antibiotic prescription, prior to your dental procedure.   For more information including helpful videos and documents visit our website:   [email or web address]

## 2023-10-01 NOTE — Anesthesia Preprocedure Evaluation (Signed)
 Anesthesia Evaluation  Patient identified by MRN, date of birth, ID band Patient awake    Reviewed: Allergy & Precautions, NPO status , Patient's Chart, lab work & pertinent test results  History of Anesthesia Complications (+) Family history of anesthesia reaction and history of anesthetic complications (father hard to wake up)  Airway Mallampati: III  TM Distance: >3 FB Neck ROM: Limited    Dental  (+) Dental Advisory Given   Pulmonary neg shortness of breath, sleep apnea (does not use CPAP) , neg COPD, neg recent URI, former smoker   Pulmonary exam normal breath sounds clear to auscultation       Cardiovascular hypertension (metoprolol ), Pt. on home beta blockers (-) angina (-) Past MI, (-) Cardiac Stents and (-) CABG (-) dysrhythmias  Rhythm:Regular Rate:Normal  HLD   Neuro/Psych  Headaches, neg Seizures PSYCHIATRIC DISORDERS (ADHD)  Depression     Neuromuscular disease (C4-7 spondylosis s/p fusion, neuropathy of both arms and legs)    GI/Hepatic negative GI ROS, Neg liver ROS,,,  Endo/Other  Pre-diabetes  Renal/GU negative Renal ROS     Musculoskeletal  (+) Arthritis , Osteoarthritis,  Fibromyalgia -  Abdominal   Peds  Hematology negative hematology ROS (+)   Anesthesia Other Findings   Reproductive/Obstetrics                             Anesthesia Physical Anesthesia Plan  ASA: 2  Anesthesia Plan: General   Post-op Pain Management: Regional block* and Tylenol  PO (pre-op)*   Induction: Intravenous  PONV Risk Score and Plan: 2 and Ondansetron, Dexamethasone, Midazolam  and Treatment may vary due to age or medical condition  Airway Management Planned: Oral ETT and Video Laryngoscope Planned  Additional Equipment:   Intra-op Plan:   Post-operative Plan: Extubation in OR  Informed Consent: I have reviewed the patients History and Physical, chart, labs and discussed the  procedure including the risks, benefits and alternatives for the proposed anesthesia with the patient or authorized representative who has indicated his/her understanding and acceptance.     Dental advisory given  Plan Discussed with: Anesthesiologist and CRNA  Anesthesia Plan Comments: (Discussed potential risks of nerve blocks including, but not limited to, infection, bleeding, nerve damage, seizures, pneumothorax, respiratory depression, and potential failure of the block. Alternatives to nerve blocks discussed. All questions answered.  Risks of general anesthesia discussed including, but not limited to, sore throat, hoarse voice, chipped/damaged teeth, injury to vocal cords, nausea and vomiting, allergic reactions, lung infection, heart attack, stroke, and death. All questions answered. )       Anesthesia Quick Evaluation

## 2023-10-02 ENCOUNTER — Other Ambulatory Visit: Payer: Self-pay

## 2023-10-02 ENCOUNTER — Encounter (HOSPITAL_COMMUNITY)
Admission: RE | Disposition: A | Discharge status: Home / Self Care | Payer: Self-pay | Source: Ambulatory Visit | Attending: Orthopaedic Surgery

## 2023-10-02 ENCOUNTER — Encounter (HOSPITAL_COMMUNITY): Payer: Self-pay | Admitting: Orthopaedic Surgery

## 2023-10-02 ENCOUNTER — Ambulatory Visit (HOSPITAL_COMMUNITY): Payer: Self-pay | Admitting: Physician Assistant

## 2023-10-02 ENCOUNTER — Ambulatory Visit (HOSPITAL_COMMUNITY)
Admission: RE | Admit: 2023-10-02 | Discharge: 2023-10-02 | Disposition: A | Discharge status: Home / Self Care | Source: Ambulatory Visit | Attending: Orthopaedic Surgery | Admitting: Orthopaedic Surgery

## 2023-10-02 ENCOUNTER — Ambulatory Visit (HOSPITAL_COMMUNITY)

## 2023-10-02 ENCOUNTER — Ambulatory Visit (HOSPITAL_COMMUNITY): Payer: Self-pay | Admitting: Anesthesiology

## 2023-10-02 DIAGNOSIS — F32A Depression, unspecified: Secondary | ICD-10-CM | POA: Diagnosis not present

## 2023-10-02 DIAGNOSIS — M797 Fibromyalgia: Secondary | ICD-10-CM | POA: Insufficient documentation

## 2023-10-02 DIAGNOSIS — F909 Attention-deficit hyperactivity disorder, unspecified type: Secondary | ICD-10-CM | POA: Insufficient documentation

## 2023-10-02 DIAGNOSIS — Z87891 Personal history of nicotine dependence: Secondary | ICD-10-CM | POA: Insufficient documentation

## 2023-10-02 DIAGNOSIS — Z7982 Long term (current) use of aspirin: Secondary | ICD-10-CM | POA: Insufficient documentation

## 2023-10-02 DIAGNOSIS — R7303 Prediabetes: Secondary | ICD-10-CM | POA: Diagnosis not present

## 2023-10-02 DIAGNOSIS — G4733 Obstructive sleep apnea (adult) (pediatric): Secondary | ICD-10-CM | POA: Insufficient documentation

## 2023-10-02 DIAGNOSIS — R519 Headache, unspecified: Secondary | ICD-10-CM | POA: Insufficient documentation

## 2023-10-02 DIAGNOSIS — I10 Essential (primary) hypertension: Secondary | ICD-10-CM | POA: Insufficient documentation

## 2023-10-02 DIAGNOSIS — Z7951 Long term (current) use of inhaled steroids: Secondary | ICD-10-CM | POA: Insufficient documentation

## 2023-10-02 DIAGNOSIS — Z79899 Other long term (current) drug therapy: Secondary | ICD-10-CM | POA: Diagnosis not present

## 2023-10-02 DIAGNOSIS — E785 Hyperlipidemia, unspecified: Secondary | ICD-10-CM | POA: Diagnosis not present

## 2023-10-02 DIAGNOSIS — M19011 Primary osteoarthritis, right shoulder: Secondary | ICD-10-CM | POA: Diagnosis present

## 2023-10-02 DIAGNOSIS — M75101 Unspecified rotator cuff tear or rupture of right shoulder, not specified as traumatic: Secondary | ICD-10-CM | POA: Insufficient documentation

## 2023-10-02 HISTORY — PX: REVERSE SHOULDER ARTHROPLASTY: SHX5054

## 2023-10-02 SURGERY — ARTHROPLASTY, SHOULDER, TOTAL, REVERSE
Anesthesia: General | Site: Shoulder | Laterality: Right

## 2023-10-02 MED ORDER — BUPIVACAINE HCL (PF) 0.5 % IJ SOLN
INTRAMUSCULAR | Status: DC | PRN
Start: 2023-10-02 — End: 2023-10-02
  Administered 2023-10-02: 10 mL via PERINEURAL

## 2023-10-02 MED ORDER — MIDAZOLAM HCL 5 MG/5ML IJ SOLN
INTRAMUSCULAR | Status: DC | PRN
  Administered 2023-10-02: 2 mg via INTRAVENOUS

## 2023-10-02 MED ORDER — ONDANSETRON HCL 4 MG PO TABS: 4.0000 mg | ORAL_TABLET | Freq: Three times a day (TID) | ORAL | 0 refills | Status: AC | PRN

## 2023-10-02 MED ORDER — FENTANYL CITRATE (PF) 100 MCG/2ML IJ SOLN
INTRAMUSCULAR | Status: AC
  Filled 2023-10-02: qty 2

## 2023-10-02 MED ORDER — PHENYLEPHRINE HCL-NACL 20-0.9 MG/250ML-% IV SOLN
INTRAVENOUS | Status: AC
  Filled 2023-10-02: qty 250

## 2023-10-02 MED ORDER — STERILE WATER FOR IRRIGATION IR SOLN
Status: DC | PRN
  Administered 2023-10-02: 2000 mL

## 2023-10-02 MED ORDER — LIDOCAINE HCL (PF) 2 % IJ SOLN
INTRAMUSCULAR | Status: AC
Start: 2023-10-02 — End: ?
  Filled 2023-10-02: qty 5

## 2023-10-02 MED ORDER — CELECOXIB 100 MG PO CAPS: 100.0000 mg | ORAL_CAPSULE | Freq: Two times a day (BID) | ORAL | 0 refills | Status: AC

## 2023-10-02 MED ORDER — FENTANYL CITRATE (PF) 100 MCG/2ML IJ SOLN
INTRAMUSCULAR | Status: DC | PRN
  Administered 2023-10-02: 75 ug via INTRAVENOUS
  Administered 2023-10-02: 25 ug via INTRAVENOUS

## 2023-10-02 MED ORDER — MIDAZOLAM HCL 2 MG/2ML IJ SOLN
INTRAMUSCULAR | Status: AC
  Filled 2023-10-02: qty 2

## 2023-10-02 MED ORDER — TRANEXAMIC ACID-NACL 1000-0.7 MG/100ML-% IV SOLN
1000.0000 mg | INTRAVENOUS | Status: AC
  Administered 2023-10-02: 1000 mg via INTRAVENOUS
  Filled 2023-10-02: qty 100

## 2023-10-02 MED ORDER — FENTANYL CITRATE PF 50 MCG/ML IJ SOSY
PREFILLED_SYRINGE | INTRAMUSCULAR | Status: AC
Start: 2023-10-02 — End: ?
  Filled 2023-10-02: qty 1

## 2023-10-02 MED ORDER — SUGAMMADEX SODIUM 200 MG/2ML IV SOLN
INTRAVENOUS | Status: DC | PRN
Start: 2023-10-02 — End: 2023-10-02
  Administered 2023-10-02: 200 mg via INTRAVENOUS

## 2023-10-02 MED ORDER — OXYCODONE HCL 5 MG PO TABS
ORAL_TABLET | ORAL | Status: AC
  Filled 2023-10-02: qty 1

## 2023-10-02 MED ORDER — AMISULPRIDE (ANTIEMETIC) 5 MG/2ML IV SOLN
10.0000 mg | Freq: Once | INTRAVENOUS | Status: DC | PRN
Start: 2023-10-02 — End: 2023-10-02

## 2023-10-02 MED ORDER — OXYCODONE HCL 5 MG PO TABS: ORAL_TABLET | ORAL | 0 refills | Status: AC

## 2023-10-02 MED ORDER — DEXAMETHASONE SODIUM PHOSPHATE 10 MG/ML IJ SOLN
INTRAMUSCULAR | Status: AC
Start: 2023-10-02 — End: ?
  Filled 2023-10-02: qty 1

## 2023-10-02 MED ORDER — DEXAMETHASONE SODIUM PHOSPHATE 10 MG/ML IJ SOLN
INTRAMUSCULAR | Status: DC | PRN
Start: 2023-10-02 — End: 2023-10-02
  Administered 2023-10-02: 4 mg via INTRAVENOUS

## 2023-10-02 MED ORDER — EPHEDRINE SULFATE-NACL 50-0.9 MG/10ML-% IV SOSY
PREFILLED_SYRINGE | INTRAVENOUS | Status: DC | PRN
  Administered 2023-10-02 (×4): 10 mg via INTRAVENOUS

## 2023-10-02 MED ORDER — ORAL CARE MOUTH RINSE: 15.0000 mL | Freq: Once | OROMUCOSAL | Status: AC

## 2023-10-02 MED ORDER — ONDANSETRON HCL 4 MG/2ML IJ SOLN
INTRAMUSCULAR | Status: AC
  Filled 2023-10-02: qty 2

## 2023-10-02 MED ORDER — PROPOFOL 10 MG/ML IV BOLUS
INTRAVENOUS | Status: AC
Start: 2023-10-02 — End: ?
  Filled 2023-10-02: qty 20

## 2023-10-02 MED ORDER — FENTANYL CITRATE PF 50 MCG/ML IJ SOSY
25.0000 ug | PREFILLED_SYRINGE | INTRAMUSCULAR | Status: DC | PRN
  Administered 2023-10-02 (×2): 50 ug via INTRAVENOUS

## 2023-10-02 MED ORDER — SUCCINYLCHOLINE CHLORIDE 200 MG/10ML IV SOSY
PREFILLED_SYRINGE | INTRAVENOUS | Status: AC
Start: 2023-10-02 — End: ?
  Filled 2023-10-02: qty 10

## 2023-10-02 MED ORDER — VANCOMYCIN HCL 1 G IV SOLR
INTRAVENOUS | Status: DC | PRN
  Administered 2023-10-02: 1000 mg via TOPICAL

## 2023-10-02 MED ORDER — OXYCODONE HCL 5 MG PO TABS
5.0000 mg | ORAL_TABLET | ORAL | Status: AC | PRN
  Administered 2023-10-02: 5 mg via ORAL

## 2023-10-02 MED ORDER — OXYCODONE HCL 5 MG PO TABS
5.0000 mg | ORAL_TABLET | Freq: Once | ORAL | Status: AC | PRN
  Administered 2023-10-02: 5 mg via ORAL

## 2023-10-02 MED ORDER — 0.9 % SODIUM CHLORIDE (POUR BTL) OPTIME
TOPICAL | Status: DC | PRN
Start: 2023-10-02 — End: 2023-10-02
  Administered 2023-10-02: 1000 mL

## 2023-10-02 MED ORDER — ONDANSETRON HCL 4 MG/2ML IJ SOLN
INTRAMUSCULAR | Status: DC | PRN
  Administered 2023-10-02: 4 mg via INTRAVENOUS

## 2023-10-02 MED ORDER — BUPIVACAINE LIPOSOME 1.3 % IJ SUSP
INTRAMUSCULAR | Status: DC | PRN
Start: 2023-10-02 — End: 2023-10-02
  Administered 2023-10-02: 10 mL via PERINEURAL

## 2023-10-02 MED ORDER — CEFAZOLIN SODIUM-DEXTROSE 2-4 GM/100ML-% IV SOLN
2.0000 g | INTRAVENOUS | Status: AC
  Administered 2023-10-02: 2 g via INTRAVENOUS
  Filled 2023-10-02: qty 100

## 2023-10-02 MED ORDER — OXYCODONE HCL 5 MG/5ML PO SOLN: 5.0000 mg | Freq: Once | ORAL | Status: AC | PRN

## 2023-10-02 MED ORDER — PROPOFOL 10 MG/ML IV BOLUS
INTRAVENOUS | Status: DC | PRN
  Administered 2023-10-02: 200 mg via INTRAVENOUS

## 2023-10-02 MED ORDER — SODIUM CHLORIDE 0.9 % IR SOLN
Status: DC | PRN
  Administered 2023-10-02: 1000 mL

## 2023-10-02 MED ORDER — GLYCOPYRROLATE 0.2 MG/ML IJ SOLN
INTRAMUSCULAR | Status: DC | PRN
  Administered 2023-10-02: .1 mg via INTRAVENOUS

## 2023-10-02 MED ORDER — FENTANYL CITRATE PF 50 MCG/ML IJ SOSY
PREFILLED_SYRINGE | INTRAMUSCULAR | Status: AC
  Filled 2023-10-02: qty 1

## 2023-10-02 MED ORDER — CHLORHEXIDINE GLUCONATE 0.12 % MT SOLN
15.0000 mL | Freq: Once | OROMUCOSAL | Status: AC
  Administered 2023-10-02: 15 mL via OROMUCOSAL

## 2023-10-02 MED ORDER — ACETAMINOPHEN 500 MG PO TABS
1000.0000 mg | ORAL_TABLET | Freq: Once | ORAL | Status: AC
Start: 2023-10-02 — End: 2023-10-02
  Administered 2023-10-02: 1000 mg via ORAL
  Filled 2023-10-02: qty 2

## 2023-10-02 MED ORDER — VANCOMYCIN HCL 1000 MG IV SOLR
INTRAVENOUS | Status: AC
  Filled 2023-10-02: qty 20

## 2023-10-02 MED ORDER — LIDOCAINE HCL (CARDIAC) PF 100 MG/5ML IV SOSY
PREFILLED_SYRINGE | INTRAVENOUS | Status: DC | PRN
  Administered 2023-10-02: 100 mg via INTRAVENOUS

## 2023-10-02 MED ORDER — ASPIRIN 81 MG PO CHEW: 81.0000 mg | CHEWABLE_TABLET | Freq: Two times a day (BID) | ORAL | 0 refills | Status: AC

## 2023-10-02 MED ORDER — LACTATED RINGERS IV SOLN: INTRAVENOUS | Status: DC

## 2023-10-02 MED ORDER — ROCURONIUM BROMIDE 100 MG/10ML IV SOLN
INTRAVENOUS | Status: DC | PRN
  Administered 2023-10-02: 60 mg via INTRAVENOUS

## 2023-10-02 MED ORDER — ROCURONIUM BROMIDE 10 MG/ML (PF) SYRINGE
PREFILLED_SYRINGE | INTRAVENOUS | Status: AC
Start: 2023-10-02 — End: ?
  Filled 2023-10-02: qty 10

## 2023-10-02 MED ORDER — ACETAMINOPHEN 500 MG PO TABS: 1000.0000 mg | ORAL_TABLET | Freq: Three times a day (TID) | ORAL | 0 refills | Status: AC

## 2023-10-02 SURGICAL SUPPLY — 53 items
BAG COUNTER SPONGE SURGICOUNT (BAG) ×1 IMPLANT
BIT DRILL 3.2 PERIPHERAL SCREW (BIT) IMPLANT
BLADE SAW SGTL 73X25 THK (BLADE) ×1 IMPLANT
CHLORAPREP W/TINT 26 (MISCELLANEOUS) ×2 IMPLANT
CLSR STERI-STRIP ANTIMIC 1/2X4 (GAUZE/BANDAGES/DRESSINGS) ×1 IMPLANT
COOLER ICEMAN CLASSIC (MISCELLANEOUS) IMPLANT
COVER BACK TABLE 60X90IN (DRAPES) ×1 IMPLANT
COVER SURGICAL LIGHT HANDLE (MISCELLANEOUS) ×1 IMPLANT
CUP HUM REV SHLD 3/4 39 +3 (Cup) IMPLANT
DRAPE C-ARM 42X120 X-RAY (DRAPES) IMPLANT
DRAPE INCISE IOBAN 66X45 STRL (DRAPES) ×1 IMPLANT
DRAPE POUCH INSTRU U-SHP 10X18 (DRAPES) ×1 IMPLANT
DRAPE SHEET LG 3/4 BI-LAMINATE (DRAPES) ×2 IMPLANT
DRAPE SURG ORHT 6 SPLT 77X108 (DRAPES) ×2 IMPLANT
DRSG AQUACEL AG ADV 3.5X 6 (GAUZE/BANDAGES/DRESSINGS) ×1 IMPLANT
ELECT BLADE TIP CTD 4 INCH (ELECTRODE) ×1 IMPLANT
ELECT PENCIL ROCKER SW 15FT (MISCELLANEOUS) ×1 IMPLANT
ELECT REM PT RETURN 15FT ADLT (MISCELLANEOUS) ×1 IMPLANT
FACESHIELD WRAPAROUND (MASK) ×3 IMPLANT
FACESHIELD WRAPAROUND OR TEAM (MASK) ×3 IMPLANT
GLENOID BASEPLATE 29 +3 (Joint) IMPLANT
GLENOSPHERE CANN +3 (Orthopedic Implant) IMPLANT
GLOVE BIO SURGEON STRL SZ 6.5 (GLOVE) ×2 IMPLANT
GLOVE BIOGEL PI IND STRL 6.5 (GLOVE) ×1 IMPLANT
GLOVE BIOGEL PI IND STRL 8 (GLOVE) ×1 IMPLANT
GLOVE ECLIPSE 8.0 STRL XLNG CF (GLOVE) ×2 IMPLANT
GOWN STRL REUS W/ TWL LRG LVL3 (GOWN DISPOSABLE) ×2 IMPLANT
GUIDEWIRE GLENOID 2.5X220 (WIRE) IMPLANT
KIT BASIN OR (CUSTOM PROCEDURE TRAY) ×1 IMPLANT
KIT STABILIZATION SHOULDER (MISCELLANEOUS) ×1 IMPLANT
KIT TURNOVER KIT A (KITS) IMPLANT
MANIFOLD NEPTUNE II (INSTRUMENTS) ×1 IMPLANT
NS IRRIG 1000ML POUR BTL (IV SOLUTION) ×1 IMPLANT
PACK SHOULDER (CUSTOM PROCEDURE TRAY) ×1 IMPLANT
PAD COLD SHLDR WRAP-ON (PAD) IMPLANT
PIN GUIDE 3X75 SHOULDER (PIN) IMPLANT
RESTRAINT HEAD UNIVERSAL NS (MISCELLANEOUS) ×1 IMPLANT
SCREW CENTRAL THREAD 6.5X45 (Screw) IMPLANT
SCREW PERIPHERAL 30 (Screw) IMPLANT
SCREW PERIPHERAL 42 (Screw) IMPLANT
SET HNDPC FAN SPRY TIP SCT (DISPOSABLE) ×1 IMPLANT
SPONGE T-LAP 4X18 ~~LOC~~+RFID (SPONGE) ×1 IMPLANT
STEM HUMERAL STD SHORT SZ3 (Joint) IMPLANT
SUCTION TUBE FRAZIER 12FR DISP (SUCTIONS) IMPLANT
SUT ETHIBOND 2 V 37 (SUTURE) ×1 IMPLANT
SUT ETHIBOND NAB CT1 #1 30IN (SUTURE) ×1 IMPLANT
SUT MNCRL AB 4-0 PS2 18 (SUTURE) ×1 IMPLANT
SUT VIC AB 0 CT1 36 (SUTURE) ×1 IMPLANT
SUT VIC AB 3-0 SH 27X BRD (SUTURE) ×1 IMPLANT
SUTURE FIBERWR #5 38 CONV NDL (SUTURE) ×2 IMPLANT
TOWEL OR 17X26 10 PK STRL BLUE (TOWEL DISPOSABLE) ×1 IMPLANT
TUBE SUCTION HIGH CAP CLEAR NV (SUCTIONS) ×1 IMPLANT
WATER STERILE IRR 1000ML POUR (IV SOLUTION) ×2 IMPLANT

## 2023-10-02 NOTE — Anesthesia Postprocedure Evaluation (Signed)
 Anesthesia Post Note  Patient: Aaron Adams  Procedure(s) Performed: ARTHROPLASTY, SHOULDER, TOTAL, REVERSE (Right: Shoulder)     Patient location during evaluation: PACU Anesthesia Type: General Level of consciousness: awake Pain management: pain level controlled Vital Signs Assessment: post-procedure vital signs reviewed and stable Respiratory status: spontaneous breathing, nonlabored ventilation and respiratory function stable Cardiovascular status: blood pressure returned to baseline and stable Postop Assessment: no apparent nausea or vomiting Anesthetic complications: no   No notable events documented.  Last Vitals:  Vitals:   10/02/23 1045 10/02/23 1100  BP: (!) 120/91   Pulse: 61   Resp:    Temp:    SpO2: 98% 99%    Last Pain:  Vitals:   10/02/23 1045  TempSrc:   PainSc: 3                  Conard Decent

## 2023-10-02 NOTE — Transfer of Care (Signed)
 Immediate Anesthesia Transfer of Care Note  Patient: Aaron Adams  Procedure(s) Performed: ARTHROPLASTY, SHOULDER, TOTAL, REVERSE (Right: Shoulder)  Patient Location: PACU  Anesthesia Type:General  Level of Consciousness: awake, alert , oriented, and patient cooperative  Airway & Oxygen Therapy: Patient Spontanous Breathing and Patient connected to face mask oxygen  Post-op Assessment: Report given to RN and Post -op Vital signs reviewed and stable  Post vital signs: Reviewed and stable  Last Vitals:  Vitals Value Taken Time  BP 143/91 10/02/23 0854  Temp    Pulse 74 10/02/23 0857  Resp 17 10/02/23 0857  SpO2 100 % 10/02/23 0857  Vitals shown include unfiled device data.  Last Pain:  Vitals:   10/02/23 0608  TempSrc: Oral  PainSc:          Complications: No notable events documented.

## 2023-10-02 NOTE — Interval H&P Note (Signed)
 All questions answered, patient wants to proceed with procedure. ? ?

## 2023-10-02 NOTE — Op Note (Signed)
 Orthopaedic Surgery Operative Note (CSN: [id number])  [name]  [birth year] Date of Surgery: 10/02/2023   Diagnoses:  Right irreparable rotator cuff tear  Procedure: Right reverse total Shoulder Arthroplasty   Operative Finding Successful completion of planned procedure.  Patient had an irreparable rotator cuff tear confirmed on visual inspection.  Bone quality was good.  Axillary nerve tug test was normal.  Post-operative plan: The patient will be NWB in sling.  The patient will be will be discharged from PACU if continues to be stable as was plan prior to surgery.  DVT prophylaxis Aspirin 81 mg twice daily for 6 weeks.  Pain control with PRN pain medication preferring oral medicines.  Follow up plan will be scheduled in approximately 7 days for incision check and XR.  Physical therapy to start immediately.  Implants: Tornier perform humeral size 3 standard stem, +3 retentive polyethylene, 39+3 glenosphere with a 29+3 baseplate with a 45 center screw and 2 peripheral locking screws  Post-Op Diagnosis: Same Surgeons:Primary: Micheline Ahr, MD Assistants:Caroline McBane, PA-C Location: [facility] ROOM 06 Anesthesia: General with Exparel Interscalene Antibiotics: Ancef 2g preop, Vancomycin 1000mg  locally Tourniquet time: None Estimated Blood Loss: 100 Complications: None Specimens: None Implants: Implant Name Type Inv. Item Serial No. Manufacturer Lot No. LRB No. Used Action  GLENOID BASEPLATE 29 +3 - [id number] Joint GLENOID BASEPLATE 29 +3 [id number] [employer]  Right 1 Implanted  GLENOSPHERE CANN +3 - [id number] Orthopedic Implant GLENOSPHERE CANN +3 [id number] [employer]  Right 1 Implanted  STEM HUMERAL STD SHORT SZ3 - [id number] Joint STEM HUMERAL STD SHORT SZ3 [id number] [employer]  Right 1 Implanted  CUP HUM REV SHLD 3/4 39 +3 - [id number] Cup CUP HUM REV SHLD 3/4 39 +3 [id number] [employer]  Right 1 Implanted  SCREW CENTRAL THREAD 6.5X45 - [id number] Screw SCREW  CENTRAL THREAD 6.5X45  [employer]  Right 1 Implanted  SCREW PERIPHERAL 42 - [id number] Screw SCREW PERIPHERAL 42  [employer]  Right 1 Implanted  SCREW PERIPHERAL 30 - [id number] Screw SCREW PERIPHERAL 30  [employer]  Right 1 Implanted    Indications for Surgery:   Aaron Adams is a 68 y.o. male with irreparable rotator cuff tear.  Benefits and risks of operative and nonoperative management were discussed prior to surgery with patient/guardian(s) and informed consent form was completed.  Infection and need for further surgery were discussed as was prosthetic stability and cuff issues.  We additionally specifically discussed risks of axillary nerve injury, infection, periprosthetic fracture, continued pain and longevity of implants prior to beginning procedure.      Procedure:   The patient was identified in the preoperative holding area where the surgical site was marked. Block placed by anesthesia with exparel.  The patient was taken to the OR where a procedural timeout was called and the above noted anesthesia was induced.  The patient was positioned beachchair on allen table with spider arm positioner.  Preoperative antibiotics were dosed.  The patient's right shoulder was prepped and draped in the usual sterile fashion.  A second preoperative timeout was called.       Standard deltopectoral approach was performed with a #10 blade. We dissected down to the subcutaneous tissues and the cephalic vein was taken laterally with the deltoid. Clavipectoral fascia was incised in line with the incision. Deep retractors were placed. The long of the biceps tendon was identified and there was significant tenosynovitis present.  Tenodesis was performed to the pectoralis tendon with #2 Ethibond. The  remaining biceps was followed up into the rotator interval where it was released.   The subscapularis was taken down in a full thickness layer with capsule along the humeral neck extending inferiorly around  the humeral head. We continued releasing the capsule directly off of the osteophytes inferiorly all the way around the corner. This allowed us  to dislocate the humeral head.   There were osteophytes along the inferior humeral neck. The osteophytes were removed with an osteotome and a rongeur.  Osteophytes were removed with a rongeur and an osteotome and the anatomic neck was well visualized.     A humeral cutting guide was used extra medullary with a pin to help control version. The version was set at 20 of retroversion. Humeral osteotomy was performed with an oscillating saw. The head fragment was passed off the back table.  A cut protector plate was placed.  The subscapularis was again identified and immediately we took care to palpate the axillary nerve anteriorly and verify its position with gentle palpation as well as the tug test.  We then released the SGHL with bovie cautery prior to placing a curved mayo at the junction of the anterior glenoid well above the axillary nerve and bluntly dissecting the subscapularis from the capsule.  We then carefully protected the axillary nerve as we gently released the inferior capsule to fully mobilize the subscapularis.  An anterior deltoid retractor was then placed as well as a small Hohmann retractor superiorly.   The remaining labrum was removed circumferentially taking great care not to disrupt the posterior capsule.   The glenoid drill guide was placed and used to drill a guide pin in the center, inferior position. The glenoid face was then reamed concentrically over the guide wire. The center hole was drilled over the guidepin in a near anatomic angle of version. Next the glenoid vault was drilled back and the vault was intact.  We were able to place our baseplate and 2 peripheral locking screws.  The base plate was screwed into the glenoid vault obtaining secure fixation. We next placed superior and inferior locking screws for additional fixation.   Glenosphere was attached as above size list.  The center screw was tightened.  We turned attention back to the humeral side. The cut protector was removed.  We then reamed for the central ball of the implant.  We sized as above.  Trial was obtained good stability and we selected real implants.  The shoulder was trialed.  There was good ROM in all planes and the shoulder was stable with no inferior translation.  The real humeral implants were opened after again confirming sizes.  The trial was removed. #5 Fiberwire x4 sutures passed through the humeral neck for subscap repair. The humeral component was press-fit obtaining a secure fit. The joint was reduced and thoroughly irrigated with pulsatile lavage. Subscap was repaired back with #5 Fiberwire sutures through bone tunnels. Hemostasis was obtained. The deltopectoral interval was reapproximated with #1 Ethibond. The subcutaneous tissues were closed with 2-0 Vicryl and the skin was closed with running monocryl.    The wounds were cleaned and dried and an Aquacel dressing was placed. The drapes taken down. The arm was placed into sling with abduction pillow. Patient was awakened, extubated, and transferred to the recovery room in stable condition. There were no intraoperative complications. The sponge, needle, and attention counts were  correct at the end of the case.     Nicholas Bari, PA-C, present and scrubbed throughout the  case, critical for completion in a timely fashion, and for retraction, instrumentation, closure.

## 2023-10-02 NOTE — Anesthesia Procedure Notes (Signed)
 Anesthesia Regional Block: Interscalene brachial plexus block   Pre-Anesthetic Checklist: , timeout performed,  Correct Patient, Correct Site, Correct Laterality,  Correct Procedure, Correct Position, site marked,  Risks and benefits discussed,  Surgical consent,  Pre-op evaluation,  At surgeon's request and post-op pain management  Laterality: Right  Prep: chloraprep       Needles:  Injection technique: Single-shot  Needle Type: Echogenic Stimulator Needle     Needle Length: 9cm  Needle Gauge: 21     Additional Needles:   Procedures:,,,, ultrasound used (permanent image in chart),,    Narrative:  Start time: 10/02/2023 7:01 AM End time: 10/02/2023 7:06 AM Injection made incrementally with aspirations every 5 mL.  Performed by: Personally  Anesthesiologist: Conard Decent, MD  Additional Notes: Discussed risks and benefits of nerve block including, but not limited to, prolonged and/or permanent nerve injury involving sensory and/or motor function. Monitors were applied and a time-out was performed. The nerve and associated structures were visualized under ultrasound guidance. After negative aspiration, local anesthetic was slowly injected around the nerve. There was no evidence of high pressure during the procedure. There were no paresthesias. VSS remained stable and the patient tolerated the procedure well.

## 2023-10-02 NOTE — Anesthesia Procedure Notes (Signed)
 Procedure Name: Intubation Date/Time: 10/02/2023 7:43 AM  Performed by: Mervyn Ace, CRNAPre-anesthesia Checklist: Patient identified, Emergency Drugs available, Suction available, Patient being monitored and Timeout performed Patient Re-evaluated:Patient Re-evaluated prior to induction Oxygen Delivery Method: Circle system utilized Preoxygenation: Pre-oxygenation with 100% oxygen Induction Type: IV induction Ventilation: Mask ventilation without difficulty Laryngoscope Size: Glidescope (GlideScope S4 blade) Grade View: Grade I Tube type: Oral Tube size: 8.0 mm Number of attempts: 1 Airway Equipment and Method: Stylet and Video-laryngoscopy Placement Confirmation: ETT inserted through vocal cords under direct vision, positive ETCO2, CO2 detector and breath sounds checked- equal and bilateral Secured at: 24 cm Tube secured with: Tape (secured with pink Hy-tape) Dental Injury: Teeth and Oropharynx as per pre-operative assessment

## 2023-10-03 ENCOUNTER — Encounter (HOSPITAL_COMMUNITY): Payer: Self-pay | Admitting: Orthopaedic Surgery
